# Patient Record
Sex: Male | Born: 1990 | Race: White | Hispanic: No | Smoking: Never smoker
Health system: Southern US, Community
[De-identification: ages and names within clinical notes are randomized; demographics above are authoritative.]

## PROBLEM LIST (undated history)

## (undated) DIAGNOSIS — R569 Unspecified convulsions: Secondary | ICD-10-CM

## (undated) DIAGNOSIS — N201 Calculus of ureter: Secondary | ICD-10-CM

## (undated) DIAGNOSIS — T50901A Poisoning by unspecified drugs, medicaments and biological substances, accidental (unintentional), initial encounter: Secondary | ICD-10-CM

## (undated) HISTORY — DX: Unspecified convulsions: R56.9

---

## 2020-04-11 ENCOUNTER — Emergency Department (HOSPITAL_BASED_OUTPATIENT_CLINIC_OR_DEPARTMENT_OTHER): Payer: Self-pay

## 2020-04-11 ENCOUNTER — Other Ambulatory Visit: Payer: Self-pay

## 2020-04-11 ENCOUNTER — Encounter (HOSPITAL_BASED_OUTPATIENT_CLINIC_OR_DEPARTMENT_OTHER): Payer: Self-pay | Admitting: Emergency Medicine

## 2020-04-11 ENCOUNTER — Emergency Department (HOSPITAL_BASED_OUTPATIENT_CLINIC_OR_DEPARTMENT_OTHER)
Admission: EM | Admit: 2020-04-11 | Discharge: 2020-04-11 | Disposition: A | Discharge status: Home / Self Care | Payer: Self-pay | Attending: Emergency Medicine | Admitting: Emergency Medicine

## 2020-04-11 DIAGNOSIS — R561 Post traumatic seizures: Secondary | ICD-10-CM | POA: Insufficient documentation

## 2020-04-11 DIAGNOSIS — F419 Anxiety disorder, unspecified: Secondary | ICD-10-CM | POA: Insufficient documentation

## 2020-04-11 DIAGNOSIS — H61892 Other specified disorders of left external ear: Secondary | ICD-10-CM | POA: Insufficient documentation

## 2020-04-11 LAB — CBC WITH DIFFERENTIAL/PLATELET
Abs Immature Granulocytes: 0.02 10*3/uL (ref 0.00–0.07)
Basophils Absolute: 0 10*3/uL (ref 0.0–0.1)
Basophils Relative: 1 %
Eosinophils Absolute: 0.5 10*3/uL (ref 0.0–0.5)
Eosinophils Relative: 6 %
HCT: 41.8 % (ref 39.0–52.0)
Hemoglobin: 13.8 g/dL (ref 13.0–17.0)
Immature Granulocytes: 0 %
Lymphocytes Relative: 22 %
Lymphs Abs: 1.8 10*3/uL (ref 0.7–4.0)
MCH: 29.7 pg (ref 26.0–34.0)
MCHC: 33 g/dL (ref 30.0–36.0)
MCV: 90.1 fL (ref 80.0–100.0)
Monocytes Absolute: 1 10*3/uL (ref 0.1–1.0)
Monocytes Relative: 12 %
Neutro Abs: 5 10*3/uL (ref 1.7–7.7)
Neutrophils Relative %: 59 %
Platelets: 287 10*3/uL (ref 150–400)
RBC: 4.64 MIL/uL (ref 4.22–5.81)
RDW: 12.6 % (ref 11.5–15.5)
WBC: 8.3 10*3/uL (ref 4.0–10.5)
nRBC: 0 % (ref 0.0–0.2)

## 2020-04-11 LAB — RAPID URINE DRUG SCREEN, HOSP PERFORMED
Amphetamines: NOT DETECTED
Barbiturates: NOT DETECTED
Benzodiazepines: NOT DETECTED
Cocaine: NOT DETECTED
Opiates: NOT DETECTED
Tetrahydrocannabinol: NOT DETECTED

## 2020-04-11 LAB — BASIC METABOLIC PANEL
Anion gap: 10 (ref 5–15)
BUN: 14 mg/dL (ref 6–20)
CO2: 24 mmol/L (ref 22–32)
Calcium: 8.7 mg/dL — ABNORMAL LOW (ref 8.9–10.3)
Chloride: 99 mmol/L (ref 98–111)
Creatinine, Ser: 0.95 mg/dL (ref 0.61–1.24)
GFR calc Af Amer: >60 mL/min (ref >60)
GFR calc non Af Amer: >60 mL/min (ref >60)
Glucose, Bld: 129 mg/dL — ABNORMAL HIGH (ref 70–99)
Potassium: 4.1 mmol/L (ref 3.5–5.1)
Sodium: 133 mmol/L — ABNORMAL LOW (ref 135–145)

## 2020-04-11 LAB — TROPONIN I (HIGH SENSITIVITY): Troponin I (High Sensitivity): 3 ng/L (ref <18)

## 2020-04-11 LAB — ETHANOL: Alcohol, Ethyl (B): <10 mg/dL (ref <10)

## 2020-04-11 LAB — CBG MONITORING, ED: Glucose-Capillary: 125 mg/dL — ABNORMAL HIGH (ref 70–99)

## 2020-04-11 MED ORDER — SODIUM CHLORIDE 0.9 % IV SOLN: INTRAVENOUS | Status: DC | PRN

## 2020-04-11 MED ORDER — LEVETIRACETAM 500 MG PO TABS: 500.0000 mg | ORAL_TABLET | Freq: Two times a day (BID) | ORAL | 0 refills | Status: DC

## 2020-04-11 MED ORDER — ONDANSETRON HCL 4 MG/2ML IJ SOLN
4.0000 mg | Freq: Once | INTRAMUSCULAR | Status: AC
  Administered 2020-04-11: 4 mg via INTRAVENOUS
  Filled 2020-04-11: qty 2

## 2020-04-11 MED ORDER — LEVETIRACETAM IN NACL 500 MG/100ML IV SOLN
500.0000 mg | Freq: Once | INTRAVENOUS | Status: AC
  Administered 2020-04-11: 500 mg via INTRAVENOUS
  Filled 2020-04-11: qty 100

## 2020-04-11 MED ORDER — LORAZEPAM 2 MG/ML IJ SOLN
1.0000 mg | Freq: Once | INTRAMUSCULAR | Status: AC
  Administered 2020-04-11: 1 mg via INTRAVENOUS
  Filled 2020-04-11: qty 1
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
## 2020-04-11 NOTE — ED Notes (Signed)
Repositioned on stretcher for comfort, lights dimmed in room, encouraged pt to not be on mobile device, pt is very anxious, restless on stretcher, pt reassured.
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
## 2020-04-11 NOTE — ED Notes (Signed)
Pt states he is really worried about what is going on with him, "I am having a lot of nausea, I keep passing out and my body keeps stiffening up", sr x 2 up, NSR noted on monitor, no LOC or syncopal type activity has been noted, med order rec from EDP

## 2020-04-11 NOTE — ED Notes (Signed)
Pt had "passing out" episode while laying flat in CT. Per CT - pt closed his eyes and held his breath, stopped responding to verbal stimuli. Immediately alert to sternal rub. Immediately back to baseline.

## 2020-04-13 ENCOUNTER — Emergency Department (HOSPITAL_COMMUNITY): Payer: Self-pay

## 2020-04-13 ENCOUNTER — Encounter (HOSPITAL_COMMUNITY): Payer: Self-pay

## 2020-04-13 ENCOUNTER — Emergency Department (HOSPITAL_COMMUNITY)
Admission: EM | Admit: 2020-04-13 | Discharge: 2020-04-13 | Discharge status: Against Medical Advice | Payer: Self-pay | Attending: Emergency Medicine | Admitting: Emergency Medicine

## 2020-04-13 DIAGNOSIS — H539 Unspecified visual disturbance: Secondary | ICD-10-CM | POA: Insufficient documentation

## 2020-04-13 DIAGNOSIS — R55 Syncope and collapse: Secondary | ICD-10-CM | POA: Insufficient documentation

## 2020-04-13 DIAGNOSIS — R569 Unspecified convulsions: Secondary | ICD-10-CM | POA: Insufficient documentation

## 2020-04-13 LAB — URINALYSIS, ROUTINE W REFLEX MICROSCOPIC
Bacteria, UA: NONE SEEN
Bilirubin Urine: NEGATIVE
Glucose, UA: NEGATIVE mg/dL
Ketones, ur: NEGATIVE mg/dL
Leukocytes,Ua: NEGATIVE
Nitrite: NEGATIVE
Protein, ur: 30 mg/dL — AB
RBC / HPF: >50 RBC/hpf — ABNORMAL HIGH (ref 0–5)
Specific Gravity, Urine: 1.023 (ref 1.005–1.030)
pH: 6 (ref 5.0–8.0)

## 2020-04-13 LAB — RAPID URINE DRUG SCREEN, HOSP PERFORMED
Amphetamines: NOT DETECTED
Barbiturates: NOT DETECTED
Benzodiazepines: NOT DETECTED
Cocaine: NOT DETECTED
Opiates: NOT DETECTED
Tetrahydrocannabinol: NOT DETECTED

## 2020-04-13 LAB — CBC WITH DIFFERENTIAL/PLATELET
Abs Immature Granulocytes: 0.04 10*3/uL (ref 0.00–0.07)
Basophils Absolute: 0 10*3/uL (ref 0.0–0.1)
Basophils Relative: 0 %
Eosinophils Absolute: 0.7 10*3/uL — ABNORMAL HIGH (ref 0.0–0.5)
Eosinophils Relative: 6 %
HCT: 44.3 % (ref 39.0–52.0)
Hemoglobin: 14.4 g/dL (ref 13.0–17.0)
Immature Granulocytes: 0 %
Lymphocytes Relative: 22 %
Lymphs Abs: 2.7 10*3/uL (ref 0.7–4.0)
MCH: 30.1 pg (ref 26.0–34.0)
MCHC: 32.5 g/dL (ref 30.0–36.0)
MCV: 92.7 fL (ref 80.0–100.0)
Monocytes Absolute: 1.6 10*3/uL — ABNORMAL HIGH (ref 0.1–1.0)
Monocytes Relative: 13 %
Neutro Abs: 7.1 10*3/uL (ref 1.7–7.7)
Neutrophils Relative %: 59 %
Platelets: 319 10*3/uL (ref 150–400)
RBC: 4.78 MIL/uL (ref 4.22–5.81)
RDW: 12.6 % (ref 11.5–15.5)
WBC: 12.1 10*3/uL — ABNORMAL HIGH (ref 4.0–10.5)
nRBC: 0 % (ref 0.0–0.2)

## 2020-04-13 LAB — LACTIC ACID, PLASMA: Lactic Acid, Venous: 1.1 mmol/L (ref 0.5–1.9)

## 2020-04-13 LAB — COMPREHENSIVE METABOLIC PANEL
ALT: 112 U/L — ABNORMAL HIGH (ref 0–44)
AST: 59 U/L — ABNORMAL HIGH (ref 15–41)
Albumin: 4.1 g/dL (ref 3.5–5.0)
Alkaline Phosphatase: 79 U/L (ref 38–126)
Anion gap: 10 (ref 5–15)
BUN: 12 mg/dL (ref 6–20)
CO2: 23 mmol/L (ref 22–32)
Calcium: 8.9 mg/dL (ref 8.9–10.3)
Chloride: 104 mmol/L (ref 98–111)
Creatinine, Ser: 1.15 mg/dL (ref 0.61–1.24)
GFR calc Af Amer: >60 mL/min (ref >60)
GFR calc non Af Amer: >60 mL/min (ref >60)
Glucose, Bld: 100 mg/dL — ABNORMAL HIGH (ref 70–99)
Potassium: 4.4 mmol/L (ref 3.5–5.1)
Sodium: 137 mmol/L (ref 135–145)
Total Bilirubin: 0.4 mg/dL (ref 0.3–1.2)
Total Protein: 7.3 g/dL (ref 6.5–8.1)

## 2020-04-13 LAB — SALICYLATE LEVEL: Salicylate Lvl: <7 mg/dL — ABNORMAL LOW (ref 7.0–30.0)

## 2020-04-13 LAB — ETHANOL: Alcohol, Ethyl (B): <10 mg/dL (ref <10)

## 2020-04-13 LAB — CK: Total CK: 237 U/L (ref 49–397)

## 2020-04-13 LAB — ACETAMINOPHEN LEVEL: Acetaminophen (Tylenol), Serum: <10 ug/mL — ABNORMAL LOW (ref 10–30)

## 2020-04-13 LAB — LIPASE, BLOOD: Lipase: 23 U/L (ref 11–51)

## 2020-04-13 MED ORDER — LORAZEPAM 2 MG/ML IJ SOLN
1.0000 mg | Freq: Once | INTRAMUSCULAR | Status: AC
  Administered 2020-04-13: 1 mg via INTRAVENOUS
  Filled 2020-04-13: qty 1

## 2020-04-13 MED ORDER — SODIUM CHLORIDE 0.9 % IV BOLUS
1000.0000 mL | Freq: Once | INTRAVENOUS | Status: AC
  Administered 2020-04-13: 1000 mL via INTRAVENOUS

## 2020-04-13 MED ORDER — DROPERIDOL 2.5 MG/ML IJ SOLN
2.5000 mg | Freq: Once | INTRAMUSCULAR | Status: AC
  Administered 2020-04-13: 2.5 mg via INTRAVENOUS
  Filled 2020-04-13: qty 2

## 2020-04-13 NOTE — Consult Note (Addendum)
Neurology Consultation Patient was discussed with me by the ED providers.  Given frequency of events (4-5 times daily) and no improvement with Keppra 500 mg, as well as concern for nonepileptic events, I thought it would be reasonable to admit the patient for spell characterization.  However he decided to leave the ED and ripped out his IVs.  Recommended no change in management at this time.  No charge, given patient declined to be evaluated by me.  Addended for charge capture

## 2020-04-13 NOTE — ED Provider Notes (Signed)
[facility] EMERGENCY DEPARTMENT Provider Note   CSN: [id number] Arrival date & time: 04/13/20  0114     History Chief Complaint  Patient presents with  . Seizures    Jordan Adams is a 29 y.o. male who returns to the ED with complaints of recurrent seizures & syncope episodes since his last ED visit. Patient states that for the past 3-4 weeks he has been having seizures S/p head injury which he describes as full body convulsions that last less than 1 minute at a time, has had 2 episodes where he has been incontinent with these episodes. Had 2-6 today which prompted ED visit- he has provided several different numbers when asked about number of seizures today. He also has episodes of syncope in which he gets lightheaded and then briefly loses consciousness- states these are different from his seizures. Seen 09/05 and was started on Keppra, has been taking this as prescribed. He is an overall poor historian. He states his vision is blurry at times. Did recently hit his head again. He is also very nauseated and having vomiting. Denies focal numbness, weakness, chest pain, or dyspnea. Denies alcohol or drug use.   HPI     History reviewed. No pertinent past medical history.  There are no problems to display for this patient.   History reviewed. No pertinent surgical history.     History reviewed. No pertinent family history.  Social History   Tobacco Use  . Smoking status: Never Smoker  . Smokeless tobacco: Current User  Substance Use Topics  . Alcohol use: Never  . Drug use: Not Currently    Comment: past heroine user    Home Medications Prior to Admission medications   Medication Sig Start Date End Date Taking? Authorizing Provider  levETIRAcetam (KEPPRA) 500 MG tablet Take 1 tablet (500 mg total) by mouth 2 (two) times daily. 04/11/20   Molpus, John, MD    Allergies    Tramadol  Review of Systems   Review of Systems  Constitutional: Negative for  chills and fever.  Eyes: Positive for visual disturbance.  Respiratory: Negative for shortness of breath.   Cardiovascular: Negative for chest pain.  Gastrointestinal: Positive for nausea and vomiting. Negative for blood in stool, constipation and diarrhea.  Genitourinary: Negative for dysuria.  Neurological: Positive for seizures and syncope. Negative for weakness and numbness.  All other systems reviewed and are negative.   Physical Exam Updated Vital Signs BP 135/66 (BP Location: Right Arm)   Pulse 95   Temp 98.3 F (36.8 C) (Oral)   Resp 16   Ht 6\' 1"  (1.854 m)   Wt 93 kg   SpO2 97%   BMI 27.05 kg/m   Physical Exam Vitals and nursing note reviewed.  Constitutional:      General: He is not in acute distress.    Appearance: He is well-developed. He is not toxic-appearing.     Comments: Patient is somewhat agitated   HENT:     Head: Normocephalic.     Comments: No raccoon eyes or battle sign.    Ears:     Comments: Dried blood present to external ear bilaterally.  There is no blood in the canal or hemotympanums noted.  No TM perforation present. Eyes:     General:        Right eye: No discharge.        Left eye: No discharge.     Extraocular Movements: Extraocular movements intact.  Conjunctiva/sclera: Conjunctivae normal.     Pupils: Pupils are equal, round, and reactive to light.     Comments: Vision grossly intact.  Cardiovascular:     Rate and Rhythm: Normal rate and regular rhythm.  Pulmonary:     Effort: Pulmonary effort is normal. No respiratory distress.     Breath sounds: Normal breath sounds. No wheezing, rhonchi or rales.  Chest:     Chest wall: No tenderness.  Abdominal:     General: There is no distension.     Palpations: Abdomen is soft.     Tenderness: There is no abdominal tenderness. There is no guarding or rebound.  Musculoskeletal:     Cervical back: Normal range of motion and neck supple. No rigidity.  Skin:    General: Skin is warm and  dry.     Comments: Patient has multiple abrasions scattered throughout his body  Neurological:     Mental Status: He is alert.     Comments: Clear speech.  CN III through XII grossly intact.  Sensation grossly intact bilateral upper and lower extremities.  5 out of 5 symmetric grip strength and strength with plantar dorsiflexion bilaterally.  Patient is ambulatory with a steady gait.  Psychiatric:     Comments: Patient with somewhat odd behavior, he is somewhat hyperverbal, tangential    ED Results / Procedures / Treatments   Labs (all labs ordered are listed, but only abnormal results are displayed) Labs Reviewed  COMPREHENSIVE METABOLIC PANEL - Abnormal; Notable for the following components:      Result Value   Glucose, Bld 100 (*)    AST 59 (*)    ALT 112 (*)    All other components within normal limits  CBC WITH DIFFERENTIAL/PLATELET - Abnormal; Notable for the following components:   WBC 12.1 (*)    Monocytes Absolute 1.6 (*)    Eosinophils Absolute 0.7 (*)    All other components within normal limits  URINALYSIS, ROUTINE W REFLEX MICROSCOPIC - Abnormal; Notable for the following components:   Color, Urine AMBER (*)    APPearance HAZY (*)    Hgb urine dipstick MODERATE (*)    Protein, ur 30 (*)    RBC / HPF >50 (*)    All other components within normal limits  ACETAMINOPHEN LEVEL - Abnormal; Notable for the following components:   Acetaminophen (Tylenol), Serum <10 (*)    All other components within normal limits  SALICYLATE LEVEL - Abnormal; Notable for the following components:   Salicylate Lvl <7.0 (*)    All other components within normal limits  LIPASE, BLOOD  ETHANOL  RAPID URINE DRUG SCREEN, HOSP PERFORMED  CK  LACTIC ACID, PLASMA  LACTIC ACID, PLASMA    EKG None  Radiology No results found.  Procedures Procedures (including critical care time)  Medications Ordered in ED Medications  sodium chloride 0.9 % bolus 1,000 mL (0 mLs Intravenous Stopped  04/13/20 0436)  droperidol (INAPSINE) 2.5 MG/ML injection 2.5 mg (2.5 mg Intravenous Given 04/13/20 0225)  LORazepam (ATIVAN) injection 1 mg (1 mg Intravenous Given 04/13/20 0225)    ED Course  I have reviewed the triage vital signs and the nursing notes.  Pertinent labs & imaging results that were available during my care of the patient were reviewed by me and considered in my medical decision making (see chart for details).  Arieon Corcoran was evaluated in Emergency Department on 04/13/2020 for the symptoms described in the history of present illness. He/she was evaluated in  the context of the global COVID-19 pandemic, which necessitated consideration that the patient might be at risk for infection with the SARS-CoV-2 virus that causes COVID-19. Institutional protocols and algorithms that pertain to the evaluation of patients at risk for COVID-19 are in a state of rapid change based on information released by regulatory bodies including the CDC and federal and state organizations. These policies and algorithms were followed during the patient's care in the ED.    MDM Rules/Calculators/A&P                          Patient presents to the ED with complaints of recurrent seizure/syncope episodes. Nontoxic, a bit agitated with peculiar behavior, vitals WNL. NO focal neurologic deficits noted on exam. Patient intermittently dry heaving.   Additional history obtained:  Additional history obtained from nursing note & chart review. Previous records obtained and reviewed.   Lab Tests:  I Ordered, reviewed, and interpreted labs, which included:  CBC: Mild leukocytosis felt to be nonspecific CMP: Mildly elevated AST/ALT, T bili within normal limits.  Renal function preserved. Lipase: Within normal limits Urinalysis: Hematuria, no UTI UDS: Negative Ethanol level: Negative Salicylate level/acetaminophen level: Within normal limits Lactic acid: Within normal limits CK: Within normal limits  Imaging Studies  ordered:  I ordered imaging studies which included CT head, I independently visualized and interpreted imaging which showed no acute intra-cranial process.   EKG with QTC within normal limits, droperidol and Ativan ordered.  Patient feeling improved S/p interventions above.  No significant electrolyte derangement or acute intracranial abnormality. I discussed with neurologist on-call Dr. Iver Nestle, plan was for admission for EEG monitoring, however upon my reassessment of the patient he is now demanding to leave the hospital immediately, we discussed our recommendation for admission, patient continued to refuse. Patient signing out AGAINST MEDICAL ADVICE- The patient and I discussed the nature and purpose, risks and benefits, as well as, the alternatives of treatment. Time was given to allow the opportunity to ask questions and consider options. After the discussion, the patient decided to refuse admission.  The patient was informed that refusal could lead to, but was not limited to, death, permanent disability, or severe pain. Prior to refusing, I determined that the patient had the capacity to make their decision and understood the consequences of that decision. After refusal, I made every reasonable effort to treat them to the best of my ability-I re- discussed with Dr. Iver Nestle would not recommend changing keppra dosing at this time- appreciate consultation, discussed need to continue this medicine and not to drive with the patient.  The patient was notified that they may return to the emergency department at any time for further treatment.    Findings and plan of care discussed with supervising physician Dr. Manus Gunning who evaluated the patient & is in agreement.   Portions of this note were generated with Scientist, clinical (histocompatibility and immunogenetics). Dictation errors may occur despite best attempts at proofreading.  Final Clinical Impression(s) / ED Diagnoses Final diagnoses:  Seizure-like activity [facility])    Rx / DC  Orders ED Discharge Orders    None       Cherly Anderson, PA-C 04/13/20 0553    Glynn Octave, MD 04/15/20 [phone]

## 2020-04-13 NOTE — Discharge Instructions (Signed)
You were seen in the emergency department today for several episodes of seizure activity.  Your labs were overall reassuring.  The CT of your head did not show any problems with your brain such as bleeding.  We recommended that she stay in the hospital and be admitted to that we can further monitor you and evaluate your seizure activity which you are refusing.  You are leaving the hospital AGAINST MEDICAL ADVICE.  Continue to take your Keppra.  Do not drive or operate heavy machinery at any time until cleared by a neurologist.  Please follow-up with neurology within the next 48 hours.  You may return to the emergency department at any time for new or worsening symptoms, any other concerns, or should you wish to be reevaluated and potentially admitted.

## 2020-04-13 NOTE — ED Notes (Signed)
Pt violently dryheaving while in the hallway, requesting water, then spitting into emesis bag. Pt denies drug use, although intermittently drowsy. Pt wakes up speaking very loudly and clapping. Pt moved into green zone room due to behavior.
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
## 2020-04-13 NOTE — ED Notes (Signed)
Pt awake at this time, asked to be unhooked from monitor and requesting to go home now, PA made aware. Pt ambulated to RR with steady gait, alert and 0x4.

## 2020-04-14 ENCOUNTER — Ambulatory Visit (INDEPENDENT_AMBULATORY_CARE_PROVIDER_SITE_OTHER): Payer: Self-pay | Admitting: Neurology

## 2020-04-14 ENCOUNTER — Encounter: Payer: Self-pay | Admitting: Neurology

## 2020-04-14 ENCOUNTER — Other Ambulatory Visit: Payer: Self-pay

## 2020-04-14 VITALS — BP 147/87 | HR 76 | Resp 18 | Ht 73.0 in | Wt 191.0 lb

## 2020-04-14 DIAGNOSIS — R4020 Unspecified coma: Secondary | ICD-10-CM

## 2020-04-14 DIAGNOSIS — R569 Unspecified convulsions: Secondary | ICD-10-CM
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
## 2020-07-07 ENCOUNTER — Other Ambulatory Visit: Payer: Self-pay

## 2020-07-07 ENCOUNTER — Emergency Department (HOSPITAL_BASED_OUTPATIENT_CLINIC_OR_DEPARTMENT_OTHER)
Admission: EM | Admit: 2020-07-07 | Discharge: 2020-07-07 | Disposition: A | Discharge status: Home / Self Care | Payer: Self-pay | Attending: Emergency Medicine | Admitting: Emergency Medicine

## 2020-07-07 DIAGNOSIS — R0989 Other specified symptoms and signs involving the circulatory and respiratory systems: Secondary | ICD-10-CM | POA: Insufficient documentation

## 2020-07-07 DIAGNOSIS — R059 Cough, unspecified: Secondary | ICD-10-CM | POA: Insufficient documentation

## 2020-07-07 DIAGNOSIS — J020 Streptococcal pharyngitis: Secondary | ICD-10-CM | POA: Insufficient documentation

## 2020-07-07 LAB — GROUP A STREP BY PCR: Group A Strep by PCR: DETECTED — AB

## 2020-07-07 MED ORDER — DEXAMETHASONE 6 MG PO TABS
10.0000 mg | ORAL_TABLET | Freq: Once | ORAL | Status: AC
  Administered 2020-07-07: 10 mg via ORAL
  Filled 2020-07-07: qty 1

## 2020-07-07 MED ORDER — AMOXICILLIN 500 MG PO CAPS: 1000.0000 mg | ORAL_CAPSULE | Freq: Every day | ORAL | 0 refills | Status: AC

## 2020-07-07 NOTE — ED Triage Notes (Signed)
Pt reports sore throat, fever and chills x2 days. Per pt his wife has strep.

## 2020-07-07 NOTE — ED Provider Notes (Signed)
[facility] EMERGENCY DEPARTMENT Provider Note   CSN: [id number] Arrival date & time: 07/07/20  1225     History Chief Complaint  Patient presents with   Sore Throat    Jordan Adams is a 29 y.o. male.  HPI      2 days of fever, sore throat.  Reports chills, headache, occasional cough and feeling of chest congestion.  His his wife was diagnosed with strep throat.  He otherwise denies nausea, vomiting, abdominal pain, diarrhea, shortness of breath.  Past Medical History:  Diagnosis Date   Seizures (HCC)     There are no problems to display for this patient.   No past surgical history on file.     No family history on file.  Social History   Tobacco Use   Smoking status: Never Smoker   Smokeless tobacco: Current User  Vaping Use   Vaping Use: Never used  Substance Use Topics   Alcohol use: Never   Drug use: Not Currently    Comment: past heroine user    Home Medications Prior to Admission medications   Medication Sig Start Date End Date Taking? Authorizing Provider  amoxicillin (AMOXIL) 500 MG capsule Take 2 capsules (1,000 mg total) by mouth daily for 10 days. 07/07/20 07/17/20  Alvira Monday, MD  levETIRAcetam (KEPPRA) 500 MG tablet Take 1 tablet (500 mg total) by mouth 2 (two) times daily. 04/11/20   Molpus, John, MD    Allergies    Tramadol  Review of Systems   Review of Systems  Constitutional: Positive for chills and fever.  HENT: Positive for sore throat.   Respiratory: Positive for cough. Negative for shortness of breath.   Cardiovascular: Positive for chest pain.  Gastrointestinal: Negative for abdominal pain, diarrhea, nausea and vomiting.  Skin: Negative for rash.  Neurological: Positive for headaches. Negative for syncope.    Physical Exam Updated Vital Signs BP 130/65 (BP Location: Left Arm)    Pulse 70    Temp 98.7 F (37.1 C) (Oral)    Resp 18    Ht 6\' 1"  (1.854 m)    Wt 94.3 kg    SpO2 99%    BMI 27.44 kg/m    Physical Exam Vitals and nursing note reviewed.  Constitutional:      General: He is not in acute distress.    Appearance: Normal appearance. He is not ill-appearing, toxic-appearing or diaphoretic.  HENT:     Head: Normocephalic.     Mouth/Throat:     Tonsils: No tonsillar exudate or tonsillar abscesses.  Eyes:     Conjunctiva/sclera: Conjunctivae normal.  Cardiovascular:     Rate and Rhythm: Normal rate and regular rhythm.     Pulses: Normal pulses.  Pulmonary:     Effort: Pulmonary effort is normal. No respiratory distress.  Musculoskeletal:        General: No deformity or signs of injury.     Cervical back: No rigidity.  Skin:    General: Skin is warm and dry.     Coloration: Skin is not jaundiced or pale.  Neurological:     General: No focal deficit present.     Mental Status: He is alert and oriented to person, place, and time.     ED Results / Procedures / Treatments   Labs (all labs ordered are listed, but only abnormal results are displayed) Labs Reviewed  GROUP A STREP BY PCR - Abnormal; Notable for the following components:      Result Value  Group A Strep by PCR DETECTED (*)    All other components within normal limits    EKG None  Radiology No results found.  Procedures Procedures (including critical care time)  Medications Ordered in ED Medications  dexamethasone (DECADRON) tablet 10 mg (has no administration in time range)    ED Course  I have reviewed the triage vital signs and the nursing notes.  Pertinent labs & imaging results that were available during my care of the patient were reviewed by me and considered in my medical decision making (see chart for details).    MDM Rules/Calculators/A&P                          29 year old male presents with concern for sore throat in setting of wife with recent positive strep test.  No signs of peritonsillar, retropharyngeal abscess on exam.  He describes some chest discomfort, discussed  getting an EKG, however when nurse went to perform it he declined testing.  Overall given symptoms I have low suspicion for this ACS or pericarditis.  Strep test is positive for strep.  Given dose of Decadron in the emergency department and prescription for amoxicillin.   Final Clinical Impression(s) / ED Diagnoses Final diagnoses:  Strep pharyngitis    Rx / DC Orders ED Discharge Orders         Ordered    amoxicillin (AMOXIL) 500 MG capsule  Daily        07/07/20 1534           Alvira Monday, MD 07/08/20 [phone]

## 2020-07-07 NOTE — ED Notes (Signed)
Pt refusing EKG, states his chest feels congested, states he is "not worried about it". Provider notified.

## 2020-07-15 ENCOUNTER — Encounter (HOSPITAL_BASED_OUTPATIENT_CLINIC_OR_DEPARTMENT_OTHER): Payer: Self-pay

## 2020-07-15 ENCOUNTER — Emergency Department (HOSPITAL_BASED_OUTPATIENT_CLINIC_OR_DEPARTMENT_OTHER)
Admission: EM | Admit: 2020-07-15 | Discharge: 2020-07-15 | Disposition: A | Discharge status: Home / Self Care | Payer: Self-pay | Attending: Emergency Medicine | Admitting: Emergency Medicine

## 2020-07-15 ENCOUNTER — Emergency Department (HOSPITAL_BASED_OUTPATIENT_CLINIC_OR_DEPARTMENT_OTHER): Payer: Self-pay

## 2020-07-15 ENCOUNTER — Other Ambulatory Visit: Payer: Self-pay

## 2020-07-15 DIAGNOSIS — R39198 Other difficulties with micturition: Secondary | ICD-10-CM | POA: Insufficient documentation

## 2020-07-15 DIAGNOSIS — R319 Hematuria, unspecified: Secondary | ICD-10-CM | POA: Insufficient documentation

## 2020-07-15 DIAGNOSIS — R109 Unspecified abdominal pain: Secondary | ICD-10-CM | POA: Insufficient documentation

## 2020-07-15 DIAGNOSIS — Z79899 Other long term (current) drug therapy: Secondary | ICD-10-CM | POA: Insufficient documentation

## 2020-07-15 HISTORY — DX: Calculus of ureter: N20.1

## 2020-07-15 LAB — URINALYSIS, ROUTINE W REFLEX MICROSCOPIC
Bilirubin Urine: NEGATIVE
Glucose, UA: NEGATIVE mg/dL
Hgb urine dipstick: NEGATIVE
Ketones, ur: NEGATIVE mg/dL
Leukocytes,Ua: NEGATIVE
Nitrite: NEGATIVE
Protein, ur: NEGATIVE mg/dL
Specific Gravity, Urine: 1.025 (ref 1.005–1.030)
pH: 6 (ref 5.0–8.0)

## 2020-07-15 MED ORDER — KETOROLAC TROMETHAMINE 15 MG/ML IJ SOLN
15.0000 mg | Freq: Once | INTRAMUSCULAR | Status: AC
  Administered 2020-07-15: 15 mg via INTRAVENOUS
  Filled 2020-07-15: qty 1

## 2020-07-15 MED ORDER — ONDANSETRON HCL 4 MG/2ML IJ SOLN
4.0000 mg | Freq: Once | INTRAMUSCULAR | Status: AC
  Administered 2020-07-15: 4 mg via INTRAVENOUS
  Filled 2020-07-15: qty 2

## 2020-07-15 MED ORDER — OXYCODONE-ACETAMINOPHEN 10-325 MG PO TABS: 1.0000 | ORAL_TABLET | Freq: Four times a day (QID) | ORAL | 0 refills | Status: DC | PRN

## 2020-07-15 MED ORDER — ONDANSETRON 8 MG PO TBDP: 8.0000 mg | ORAL_TABLET | Freq: Three times a day (TID) | ORAL | 0 refills | Status: DC | PRN

## 2020-07-15 MED ORDER — HYDROMORPHONE HCL 1 MG/ML IJ SOLN
1.0000 mg | Freq: Once | INTRAMUSCULAR | Status: AC
  Administered 2020-07-15: 1 mg via INTRAVENOUS
  Filled 2020-07-15: qty 1

## 2020-07-15 NOTE — ED Triage Notes (Signed)
Flank pain x3 days, pt thinks he has a kidney stone, took iubprofen ~2200 yesterday

## 2020-07-15 NOTE — ED Notes (Signed)
Patient transported to CT 

## 2020-07-15 NOTE — ED Notes (Signed)
Pt called for ride home, IV removed, vitals WNL. Pt left ED w/o incident - verbalized understanding of where to obtain prescriptions.

## 2020-07-15 NOTE — ED Provider Notes (Signed)
[facility] Provider Note: Lowella Dell, MD, FACEP  CSN: [id number] MRN: [id number] ARRIVAL: 07/15/20 at 0318 ROOM: [facility]   CHIEF COMPLAINT  Flank Pain   HISTORY OF PRESENT ILLNESS  07/15/20 3:45 AM Jordan Adams is a 29 y.o. male with 3 days of left flank pain.  The pain is severe, rated as a 10 out of 10, and like previous kidney stones.  It is somewhat worse with movement.  He is having difficulty finding a comfortable position.  It does not radiate to his left lower quadrant.  He has had hematuria and difficulty urinating.  His pain worsened yesterday and he began having associated nausea and vomiting and a near syncopal episode.  He has taken ibuprofen without significant relief.   Past Medical History:  Diagnosis Date  . Seizures (HCC)   . Ureterolithiasis     History reviewed. No pertinent surgical history.  History reviewed. No pertinent family history.  Social History   Tobacco Use  . Smoking status: Never Smoker  . Smokeless tobacco: Current User  Vaping Use  . Vaping Use: Never used  Substance Use Topics  . Alcohol use: Never  . Drug use: Not Currently    Comment: past heroine user    Prior to Admission medications   Medication Sig Start Date End Date Taking? Authorizing Provider  amoxicillin (AMOXIL) 500 MG capsule Take 2 capsules (1,000 mg total) by mouth daily for 10 days. 07/07/20 07/17/20  Alvira Monday, MD  levETIRAcetam (KEPPRA) 500 MG tablet Take 1 tablet (500 mg total) by mouth 2 (two) times daily. 04/11/20   Cyrstal Leitz, MD  ondansetron (ZOFRAN ODT) 8 MG disintegrating tablet Take 1 tablet (8 mg total) by mouth every 8 (eight) hours as needed for nausea or vomiting. 07/15/20   Riel Hirschman, MD  oxyCODONE-acetaminophen (PERCOCET) 10-325 MG tablet Take 1 tablet by mouth every 6 (six) hours as needed for pain. 07/15/20   Yameli Delamater, MD    Allergies Tramadol   REVIEW OF SYSTEMS  Negative except as noted here or in the History of  Present Illness.   PHYSICAL EXAMINATION  Initial Vital Signs Blood pressure 122/77, pulse 69, temperature 98 F (36.7 C), resp. rate 18, height 6\' 1"  (1.854 m), weight 95.3 kg, SpO2 99 %.  Examination General: Well-developed, well-nourished male in no acute distress; appearance consistent with age of record HENT: normocephalic; atraumatic Eyes: pupils equal, round and reactive to light; extraocular muscles intact Neck: supple Heart: regular rate and rhythm Lungs: clear to auscultation bilaterally Abdomen: soft; nondistended; nontender; bowel sounds present GU: Mild left CVA tenderness Extremities: No deformity; full range of motion; pulses normal Neurologic: Awake, alert and oriented; motor function intact in all extremities and symmetric; no facial droop Skin: Warm and dry Psychiatric: Grimacing   RESULTS  Summary of this visit's results, reviewed and interpreted by myself:   EKG Interpretation  Date/Time:    Ventricular Rate:    PR Interval:    QRS Duration:   QT Interval:    QTC Calculation:   R Axis:     Text Interpretation:        Laboratory Studies: Results for orders placed or performed during the hospital encounter of 07/15/20 (from the past 24 hour(s))  Urinalysis, Routine w reflex microscopic Urine, Clean Catch     Status: None   Collection Time: 07/15/20  4:18 AM  Result Value Ref Range   Color, Urine YELLOW YELLOW   APPearance CLEAR CLEAR   Specific Gravity,  Urine 1.025 1.005 - 1.030   pH 6.0 5.0 - 8.0   Glucose, UA NEGATIVE NEGATIVE mg/dL   Hgb urine dipstick NEGATIVE NEGATIVE   Bilirubin Urine NEGATIVE NEGATIVE   Ketones, ur NEGATIVE NEGATIVE mg/dL   Protein, ur NEGATIVE NEGATIVE mg/dL   Nitrite NEGATIVE NEGATIVE   Leukocytes,Ua NEGATIVE NEGATIVE   Imaging Studies: CT Renal Stone Study  Result Date: 07/15/2020 CLINICAL DATA:  Flank pain for 3 days.  Possible kidney stone EXAM: CT ABDOMEN AND PELVIS WITHOUT CONTRAST TECHNIQUE: Multidetector CT  imaging of the abdomen and pelvis was performed following the standard protocol without IV contrast. COMPARISON:  None. FINDINGS: Lower chest:  No contributory findings. Hepatobiliary: No focal liver abnormality.No evidence of biliary obstruction or stone. Pancreas: Unremarkable. Spleen: Unremarkable. Adrenals/Urinary Tract: Negative adrenals. No hydronephrosis or stone. Unremarkable bladder. Stomach/Bowel:  No obstruction. No appendicitis. Vascular/Lymphatic: No acute vascular abnormality. No mass or adenopathy. Reproductive:No pathologic findings. Other: No ascites or pneumoperitoneum. Musculoskeletal: No acute abnormalities. Chronic bilateral L5 pars defect. No listhesis IMPRESSION: No acute finding.  No hydronephrosis or urolithiasis. Electronically Signed   By: Marnee Spring M.D.   On: 07/15/2020 04:17    ED COURSE and MDM  Nursing notes, initial and subsequent vitals signs, including pulse oximetry, reviewed and interpreted by myself.  Vitals:   07/15/20 0331 07/15/20 0332 07/15/20 0354 07/15/20 0445  BP: 122/77  137/75 97/78  Pulse: 69  72 63  Resp: 18  18 17   Temp: 98 F (36.7 C)     SpO2: 99%  100% 100%  Weight:  95.3 kg    Height:  6\' 1"  (1.854 m)     Medications  ondansetron (ZOFRAN) injection 4 mg (4 mg Intravenous Given 07/15/20 0352)  HYDROmorphone (DILAUDID) injection 1 mg (1 mg Intravenous Given 07/15/20 0352)  ketorolac (TORADOL) 15 MG/ML injection 15 mg (15 mg Intravenous Given 07/15/20 0434)   5:20 AM Times now well controlled with IV meds.  Patient advised of negative urinalysis and negative CT scan.  His symptomatology suggests kidney stone that has passed.  He may have some residual pain but should be improving.  He was advised this could be musculoskeletal or neuropathic pain.  PROCEDURES  Procedures   ED DIAGNOSES     ICD-10-CM   1. Left flank pain  R10.9        Mammie Meras, 14/9/21, MD 07/15/20 [phone]

## 2021-02-10 ENCOUNTER — Emergency Department (HOSPITAL_BASED_OUTPATIENT_CLINIC_OR_DEPARTMENT_OTHER): Admission: EM | Admit: 2021-02-10 | Discharge: 2021-02-10 | Discharge status: Against Medical Advice | Payer: Self-pay

## 2021-03-04 ENCOUNTER — Encounter (HOSPITAL_COMMUNITY): Payer: Self-pay | Admitting: Internal Medicine

## 2021-03-04 ENCOUNTER — Other Ambulatory Visit: Payer: Self-pay

## 2021-03-04 ENCOUNTER — Inpatient Hospital Stay (HOSPITAL_COMMUNITY)
Admission: EM | Admit: 2021-03-04 | Discharge: 2021-03-07 | DRG: 871 | Disposition: A | Discharge status: Home / Self Care | Payer: Self-pay | Attending: Critical Care Medicine | Admitting: Critical Care Medicine

## 2021-03-04 ENCOUNTER — Emergency Department (HOSPITAL_COMMUNITY): Payer: Self-pay

## 2021-03-04 ENCOUNTER — Inpatient Hospital Stay (HOSPITAL_COMMUNITY): Payer: Self-pay

## 2021-03-04 DIAGNOSIS — G928 Other toxic encephalopathy: Secondary | ICD-10-CM | POA: Diagnosis present

## 2021-03-04 DIAGNOSIS — A419 Sepsis, unspecified organism: Principal | ICD-10-CM | POA: Diagnosis present

## 2021-03-04 DIAGNOSIS — G9340 Encephalopathy, unspecified: Secondary | ICD-10-CM

## 2021-03-04 DIAGNOSIS — Z20822 Contact with and (suspected) exposure to covid-19: Secondary | ICD-10-CM | POA: Diagnosis present

## 2021-03-04 DIAGNOSIS — T148XXA Other injury of unspecified body region, initial encounter: Secondary | ICD-10-CM | POA: Diagnosis present

## 2021-03-04 DIAGNOSIS — H109 Unspecified conjunctivitis: Secondary | ICD-10-CM | POA: Diagnosis present

## 2021-03-04 DIAGNOSIS — G40909 Epilepsy, unspecified, not intractable, without status epilepticus: Secondary | ICD-10-CM | POA: Diagnosis present

## 2021-03-04 DIAGNOSIS — X58XXXA Exposure to other specified factors, initial encounter: Secondary | ICD-10-CM | POA: Diagnosis present

## 2021-03-04 DIAGNOSIS — J80 Acute respiratory distress syndrome: Secondary | ICD-10-CM | POA: Diagnosis present

## 2021-03-04 DIAGNOSIS — J982 Interstitial emphysema: Secondary | ICD-10-CM | POA: Diagnosis not present

## 2021-03-04 DIAGNOSIS — R300 Dysuria: Secondary | ICD-10-CM | POA: Diagnosis present

## 2021-03-04 DIAGNOSIS — T402X4A Poisoning by other opioids, undetermined, initial encounter: Secondary | ICD-10-CM

## 2021-03-04 DIAGNOSIS — J690 Pneumonitis due to inhalation of food and vomit: Secondary | ICD-10-CM | POA: Diagnosis present

## 2021-03-04 DIAGNOSIS — T401X1A Poisoning by heroin, accidental (unintentional), initial encounter: Secondary | ICD-10-CM | POA: Diagnosis present

## 2021-03-04 DIAGNOSIS — N179 Acute kidney failure, unspecified: Secondary | ICD-10-CM | POA: Diagnosis present

## 2021-03-04 DIAGNOSIS — F1120 Opioid dependence, uncomplicated: Secondary | ICD-10-CM | POA: Diagnosis present

## 2021-03-04 DIAGNOSIS — Z9151 Personal history of suicidal behavior: Secondary | ICD-10-CM

## 2021-03-04 DIAGNOSIS — E875 Hyperkalemia: Secondary | ICD-10-CM | POA: Diagnosis present

## 2021-03-04 DIAGNOSIS — R7401 Elevation of levels of liver transaminase levels: Secondary | ICD-10-CM | POA: Diagnosis present

## 2021-03-04 DIAGNOSIS — E872 Acidosis: Secondary | ICD-10-CM | POA: Diagnosis present

## 2021-03-04 DIAGNOSIS — Z79899 Other long term (current) drug therapy: Secondary | ICD-10-CM

## 2021-03-04 DIAGNOSIS — T402X1A Poisoning by other opioids, accidental (unintentional), initial encounter: Secondary | ICD-10-CM | POA: Diagnosis present

## 2021-03-04 DIAGNOSIS — Y92481 Parking lot as the place of occurrence of the external cause: Secondary | ICD-10-CM

## 2021-03-04 DIAGNOSIS — Z87442 Personal history of urinary calculi: Secondary | ICD-10-CM

## 2021-03-04 DIAGNOSIS — Z885 Allergy status to narcotic agent status: Secondary | ICD-10-CM

## 2021-03-04 DIAGNOSIS — J9601 Acute respiratory failure with hypoxia: Secondary | ICD-10-CM

## 2021-03-04 LAB — CK: Total CK: 268 U/L (ref 49–397)

## 2021-03-04 LAB — CBC WITH DIFFERENTIAL/PLATELET
Abs Immature Granulocytes: 0.05 10*3/uL (ref 0.00–0.07)
Basophils Absolute: 0 10*3/uL (ref 0.0–0.1)
Basophils Relative: 0 %
Eosinophils Absolute: 0 10*3/uL (ref 0.0–0.5)
Eosinophils Relative: 0 %
HCT: 49.1 % (ref 39.0–52.0)
Hemoglobin: 15.7 g/dL (ref 13.0–17.0)
Immature Granulocytes: 0 %
Lymphocytes Relative: 5 %
Lymphs Abs: 0.7 10*3/uL (ref 0.7–4.0)
MCH: 30.5 pg (ref 26.0–34.0)
MCHC: 32 g/dL (ref 30.0–36.0)
MCV: 95.3 fL (ref 80.0–100.0)
Monocytes Absolute: 0.9 10*3/uL (ref 0.1–1.0)
Monocytes Relative: 7 %
Neutro Abs: 11 10*3/uL — ABNORMAL HIGH (ref 1.7–7.7)
Neutrophils Relative %: 88 %
Platelets: 270 10*3/uL (ref 150–400)
RBC: 5.15 MIL/uL (ref 4.22–5.81)
RDW: 12.2 % (ref 11.5–15.5)
WBC: 12.6 10*3/uL — ABNORMAL HIGH (ref 4.0–10.5)
nRBC: 0 % (ref 0.0–0.2)

## 2021-03-04 LAB — ETHANOL: Alcohol, Ethyl (B): <10 mg/dL (ref <10)

## 2021-03-04 LAB — I-STAT ARTERIAL BLOOD GAS, ED
Acid-base deficit: 9 mmol/L — ABNORMAL HIGH (ref 0.0–2.0)
Acid-base deficit: 4 mmol/L — ABNORMAL HIGH (ref 0.0–2.0)
Bicarbonate: 22.9 mmol/L (ref 20.0–28.0)
Bicarbonate: 22 mmol/L (ref 20.0–28.0)
Calcium, Ion: 1.16 mmol/L (ref 1.15–1.40)
Calcium, Ion: 1.04 mmol/L — ABNORMAL LOW (ref 1.15–1.40)
HCT: 50 % (ref 39.0–52.0)
HCT: 46 % (ref 39.0–52.0)
Hemoglobin: 17 g/dL (ref 13.0–17.0)
Hemoglobin: 15.6 g/dL (ref 13.0–17.0)
O2 Saturation: 78 %
O2 Saturation: 100 %
Potassium: 5.5 mmol/L — ABNORMAL HIGH (ref 3.5–5.1)
Potassium: 5.1 mmol/L (ref 3.5–5.1)
Sodium: 131 mmol/L — ABNORMAL LOW (ref 135–145)
Sodium: 133 mmol/L — ABNORMAL LOW (ref 135–145)
TCO2: 25 mmol/L (ref 22–32)
TCO2: 23 mmol/L (ref 22–32)
pCO2 arterial: 72.4 mmHg (ref 32.0–48.0)
pCO2 arterial: 41 mmHg (ref 32.0–48.0)
pH, Arterial: 7.109 — CL (ref 7.350–7.450)
pH, Arterial: 7.336 — ABNORMAL LOW (ref 7.350–7.450)
pO2, Arterial: 59 mmHg — ABNORMAL LOW (ref 83.0–108.0)
pO2, Arterial: 433 mmHg — ABNORMAL HIGH (ref 83.0–108.0)

## 2021-03-04 LAB — GLUCOSE, CAPILLARY
Glucose-Capillary: 95 mg/dL (ref 70–99)
Glucose-Capillary: 100 mg/dL — ABNORMAL HIGH (ref 70–99)
Glucose-Capillary: 125 mg/dL — ABNORMAL HIGH (ref 70–99)
Glucose-Capillary: 110 mg/dL — ABNORMAL HIGH (ref 70–99)

## 2021-03-04 LAB — BASIC METABOLIC PANEL
Anion gap: 9 (ref 5–15)
BUN: 19 mg/dL (ref 6–20)
CO2: 21 mmol/L — ABNORMAL LOW (ref 22–32)
Calcium: 7.6 mg/dL — ABNORMAL LOW (ref 8.9–10.3)
Chloride: 104 mmol/L (ref 98–111)
Creatinine, Ser: 1.4 mg/dL — ABNORMAL HIGH (ref 0.61–1.24)
GFR, Estimated: >60 mL/min (ref >60)
Glucose, Bld: 106 mg/dL — ABNORMAL HIGH (ref 70–99)
Potassium: 4.4 mmol/L (ref 3.5–5.1)
Sodium: 134 mmol/L — ABNORMAL LOW (ref 135–145)

## 2021-03-04 LAB — HIV ANTIBODY (ROUTINE TESTING W REFLEX): HIV Screen 4th Generation wRfx: NONREACTIVE

## 2021-03-04 LAB — RAPID URINE DRUG SCREEN, HOSP PERFORMED
Amphetamines: NOT DETECTED
Barbiturates: NOT DETECTED
Benzodiazepines: NOT DETECTED
Cocaine: NOT DETECTED
Opiates: POSITIVE — AB
Tetrahydrocannabinol: NOT DETECTED

## 2021-03-04 LAB — LACTIC ACID, PLASMA
Lactic Acid, Venous: 8.2 mmol/L (ref 0.5–1.9)
Lactic Acid, Venous: 2.5 mmol/L (ref 0.5–1.9)
Lactic Acid, Venous: 2.1 mmol/L (ref 0.5–1.9)

## 2021-03-04 LAB — URINALYSIS, ROUTINE W REFLEX MICROSCOPIC
Bilirubin Urine: NEGATIVE
Glucose, UA: 150 mg/dL — AB
Ketones, ur: NEGATIVE mg/dL
Leukocytes,Ua: NEGATIVE
Nitrite: NEGATIVE
Protein, ur: 30 mg/dL — AB
Specific Gravity, Urine: 1.019 (ref 1.005–1.030)
pH: 5 (ref 5.0–8.0)

## 2021-03-04 LAB — COMPREHENSIVE METABOLIC PANEL
ALT: 189 U/L — ABNORMAL HIGH (ref 0–44)
AST: 157 U/L — ABNORMAL HIGH (ref 15–41)
Albumin: 3.7 g/dL (ref 3.5–5.0)
Alkaline Phosphatase: 92 U/L (ref 38–126)
Anion gap: 15 (ref 5–15)
BUN: 18 mg/dL (ref 6–20)
CO2: 19 mmol/L — ABNORMAL LOW (ref 22–32)
Calcium: 8.1 mg/dL — ABNORMAL LOW (ref 8.9–10.3)
Chloride: 99 mmol/L (ref 98–111)
Creatinine, Ser: 2.05 mg/dL — ABNORMAL HIGH (ref 0.61–1.24)
GFR, Estimated: 44 mL/min — ABNORMAL LOW (ref >60)
Glucose, Bld: 289 mg/dL — ABNORMAL HIGH (ref 70–99)
Potassium: 5.3 mmol/L — ABNORMAL HIGH (ref 3.5–5.1)
Sodium: 133 mmol/L — ABNORMAL LOW (ref 135–145)
Total Bilirubin: 0.9 mg/dL (ref 0.3–1.2)
Total Protein: 6.9 g/dL (ref 6.5–8.1)

## 2021-03-04 LAB — MRSA NEXT GEN BY PCR, NASAL: MRSA by PCR Next Gen: NOT DETECTED

## 2021-03-04 LAB — ACETAMINOPHEN LEVEL: Acetaminophen (Tylenol), Serum: <10 ug/mL — ABNORMAL LOW (ref 10–30)

## 2021-03-04 LAB — BRAIN NATRIURETIC PEPTIDE: B Natriuretic Peptide: 10.6 pg/mL (ref 0.0–100.0)

## 2021-03-04 LAB — RESP PANEL BY RT-PCR (FLU A&B, COVID) ARPGX2
Influenza A by PCR: NEGATIVE
Influenza B by PCR: NEGATIVE
SARS Coronavirus 2 by RT PCR: NEGATIVE

## 2021-03-04 LAB — SALICYLATE LEVEL: Salicylate Lvl: <7 mg/dL — ABNORMAL LOW (ref 7.0–30.0)

## 2021-03-04 LAB — TROPONIN I (HIGH SENSITIVITY)
Troponin I (High Sensitivity): 32 ng/L — ABNORMAL HIGH (ref <18)
Troponin I (High Sensitivity): 37 ng/L — ABNORMAL HIGH (ref <18)

## 2021-03-04 MED ORDER — PROPOFOL 1000 MG/100ML IV EMUL
INTRAVENOUS | Status: AC
  Filled 2021-03-04: qty 100

## 2021-03-04 MED ORDER — PROPOFOL 1000 MG/100ML IV EMUL
0.0000 ug/kg/min | INTRAVENOUS | Status: DC
  Administered 2021-03-04: 30 ug/kg/min via INTRAVENOUS

## 2021-03-04 MED ORDER — POLYETHYLENE GLYCOL 3350 17 G PO PACK
17.0000 g | PACK | Freq: Every day | ORAL | Status: DC
Start: 2021-03-04 — End: 2021-03-06
  Administered 2021-03-05: 17 g
  Filled 2021-03-04: qty 1

## 2021-03-04 MED ORDER — FENTANYL CITRATE (PF) 100 MCG/2ML IJ SOLN: 50.0000 ug | INTRAMUSCULAR | Status: DC | PRN

## 2021-03-04 MED ORDER — CIPROFLOXACIN HCL 0.3 % OP SOLN
2.0000 [drp] | OPHTHALMIC | Status: AC
  Administered 2021-03-04 – 2021-03-06 (×17): 2 [drp] via OPHTHALMIC
  Filled 2021-03-04: qty 2.5

## 2021-03-04 MED ORDER — LEVETIRACETAM IN NACL 500 MG/100ML IV SOLN
500.0000 mg | Freq: Two times a day (BID) | INTRAVENOUS | Status: DC
  Administered 2021-03-04 – 2021-03-06 (×5): 500 mg via INTRAVENOUS
  Filled 2021-03-04 (×6): qty 100

## 2021-03-04 MED ORDER — SODIUM CHLORIDE 0.9 % IV BOLUS
1000.0000 mL | Freq: Once | INTRAVENOUS | Status: AC
  Administered 2021-03-04: 1000 mL via INTRAVENOUS

## 2021-03-04 MED ORDER — ETOMIDATE 2 MG/ML IV SOLN
20.0000 mg | Freq: Once | INTRAVENOUS | Status: AC
  Administered 2021-03-04: 20 mg via INTRAVENOUS

## 2021-03-04 MED ORDER — PROPOFOL BOLUS VIA INFUSION
50.0000 mg | Freq: Once | INTRAVENOUS | Status: AC
  Administered 2021-03-04: 50 mg via INTRAVENOUS
  Filled 2021-03-04: qty 50

## 2021-03-04 MED ORDER — HYDROCORTISONE 1 % EX CREA
1.0000 "application " | TOPICAL_CREAM | Freq: Two times a day (BID) | CUTANEOUS | Status: DC
  Administered 2021-03-04 – 2021-03-07 (×6): 1 via TOPICAL
  Filled 2021-03-04 (×4): qty 28

## 2021-03-04 MED ORDER — HEPARIN SODIUM (PORCINE) 5000 UNIT/ML IJ SOLN
5000.0000 [IU] | Freq: Three times a day (TID) | INTRAMUSCULAR | Status: DC
  Administered 2021-03-04 – 2021-03-06 (×6): 5000 [IU] via SUBCUTANEOUS
  Filled 2021-03-04 (×7): qty 1

## 2021-03-04 MED ORDER — CHLORHEXIDINE GLUCONATE CLOTH 2 % EX PADS
6.0000 | MEDICATED_PAD | Freq: Every day | CUTANEOUS | Status: DC
  Administered 2021-03-04 – 2021-03-06 (×3): 6 via TOPICAL

## 2021-03-04 MED ORDER — CIPROFLOXACIN HCL 0.3 % OP SOLN
2.0000 [drp] | OPHTHALMIC | Status: DC
  Administered 2021-03-06 – 2021-03-07 (×4): 2 [drp] via OPHTHALMIC
  Filled 2021-03-04: qty 2.5

## 2021-03-04 MED ORDER — DOCUSATE SODIUM 50 MG/5ML PO LIQD
100.0000 mg | Freq: Two times a day (BID) | ORAL | Status: DC
  Administered 2021-03-04 – 2021-03-05 (×3): 100 mg
  Filled 2021-03-04 (×3): qty 10

## 2021-03-04 MED ORDER — FENTANYL CITRATE (PF) 100 MCG/2ML IJ SOLN
50.0000 ug | INTRAMUSCULAR | Status: DC | PRN
  Administered 2021-03-04 – 2021-03-05 (×4): 100 ug via INTRAVENOUS
  Administered 2021-03-05: 200 ug via INTRAVENOUS
  Administered 2021-03-05: 100 ug via INTRAVENOUS
  Administered 2021-03-05: 150 ug via INTRAVENOUS
  Administered 2021-03-05: 200 ug via INTRAVENOUS
  Administered 2021-03-06: 100 ug via INTRAVENOUS
  Filled 2021-03-04: qty 4
  Filled 2021-03-04: qty 2
  Filled 2021-03-04: qty 4
  Filled 2021-03-04 (×2): qty 2
  Filled 2021-03-04: qty 4

## 2021-03-04 MED ORDER — SODIUM CHLORIDE 0.9 % IV SOLN
1.5000 g | Freq: Four times a day (QID) | INTRAVENOUS | Status: DC
  Filled 2021-03-04 (×3): qty 4

## 2021-03-04 MED ORDER — PANTOPRAZOLE SODIUM 40 MG IV SOLR
40.0000 mg | Freq: Every day | INTRAVENOUS | Status: DC
  Administered 2021-03-04 – 2021-03-05 (×2): 40 mg via INTRAVENOUS
  Filled 2021-03-04 (×2): qty 40

## 2021-03-04 MED ORDER — CHLORHEXIDINE GLUCONATE 0.12% ORAL RINSE (MEDLINE KIT)
15.0000 mL | Freq: Two times a day (BID) | OROMUCOSAL | Status: DC
  Administered 2021-03-04 – 2021-03-06 (×4): 15 mL via OROMUCOSAL

## 2021-03-04 MED ORDER — DOCUSATE SODIUM 50 MG/5ML PO LIQD: 100.0000 mg | Freq: Two times a day (BID) | ORAL | Status: DC | PRN

## 2021-03-04 MED ORDER — AMPICILLIN-SULBACTAM SODIUM 3 (2-1) G IJ SOLR
3.0000 g | Freq: Four times a day (QID) | INTRAMUSCULAR | Status: DC
  Administered 2021-03-04 – 2021-03-06 (×8): 3 g via INTRAVENOUS
  Filled 2021-03-04 (×9): qty 8

## 2021-03-04 MED ORDER — ALBUTEROL SULFATE (2.5 MG/3ML) 0.083% IN NEBU
5.0000 mg | INHALATION_SOLUTION | Freq: Once | RESPIRATORY_TRACT | Status: AC
  Administered 2021-03-04: 5 mg via RESPIRATORY_TRACT

## 2021-03-04 MED ORDER — ORAL CARE MOUTH RINSE
15.0000 mL | OROMUCOSAL | Status: DC
  Administered 2021-03-04 – 2021-03-06 (×18): 15 mL via OROMUCOSAL

## 2021-03-04 MED ORDER — PROPOFOL 1000 MG/100ML IV EMUL
0.0000 ug/kg/min | INTRAVENOUS | Status: DC
Start: 2021-03-04 — End: 2021-03-06
  Administered 2021-03-04 – 2021-03-05 (×6): 50 ug/kg/min via INTRAVENOUS
  Administered 2021-03-05: 45 ug/kg/min via INTRAVENOUS
  Administered 2021-03-05: 50 ug/kg/min via INTRAVENOUS
  Administered 2021-03-05: 45 ug/kg/min via INTRAVENOUS
  Administered 2021-03-05 (×2): 50 ug/kg/min via INTRAVENOUS
  Administered 2021-03-05: 45 ug/kg/min via INTRAVENOUS
  Administered 2021-03-05 – 2021-03-06 (×5): 50 ug/kg/min via INTRAVENOUS
  Filled 2021-03-04 (×16): qty 100

## 2021-03-04 MED ORDER — IOHEXOL 350 MG/ML SOLN
100.0000 mL | Freq: Once | INTRAVENOUS | Status: AC | PRN
  Administered 2021-03-04: 100 mL via INTRAVENOUS

## 2021-03-04 MED ORDER — LACTATED RINGERS IV SOLN: INTRAVENOUS | Status: DC

## 2021-03-04 MED ORDER — PROPOFOL 1000 MG/100ML IV EMUL: 5.0000 ug/kg/min | INTRAVENOUS | Status: DC

## 2021-03-04 MED ORDER — ROCURONIUM BROMIDE 50 MG/5ML IV SOLN
100.0000 mg | Freq: Once | INTRAVENOUS | Status: AC
  Administered 2021-03-04: 100 mg via INTRAVENOUS

## 2021-03-04 MED ORDER — POLYETHYLENE GLYCOL 3350 17 G PO PACK: 17.0000 g | PACK | Freq: Every day | ORAL | Status: DC | PRN

## 2021-03-04 NOTE — H&P (Addendum)
NAME:  Lynne Righi, MRN:  [id number], DOB:  [birth year], LOS: 0 ADMISSION DATE:  03/04/2021, CONSULTATION DATE: 7/29 REFERRING MD: Dr. Audley Hose EDP, CHIEF COMPLAINT: Unresponsive  History of Present Illness:  30 year old male with past medical history as below, which is significant for opioid dependence and seizures on Keppra.  The patient presented to [facility] emergency department on 7/29 via EMS after being found unresponsive.  He was laying down in a grocery store parking lot.  Someone saw him at 5 AM laying there, but he was able to awaken and interact so they did not call EMS.  At around 7 AM he was found to be unresponsive at which point EMS was requested.  Upon EMS arrival the patient was indeed found to be unresponsive and was hypoxemic to 73% prompting transfer on nonrebreather.  He was given a dose of Narcan, to which he did have a significant response and was screaming at staff.  Ultimately given the patient's excited state and hypoxemia he required sedation and intubation.  PCCM was asked to evaluate for admission.  Pertinent  Medical History  Opioid dependence Seizures  Significant Hospital Events: Including procedures, antibiotic start and stop dates in addition to other pertinent events   7/29 admit  Interim History / Subjective:    Objective   Blood pressure 108/68, pulse 98, temperature (!) 97.1 F (36.2 C), temperature source Temporal, resp. rate (!) 28, height 6\' 1"  (1.854 m), weight 120 kg, SpO2 100 %.    Vent Mode: PRVC FiO2 (%):  [100 %] 100 % Set Rate:  [20 bmp] 20 bmp Vt Set:  mL] 630 mL PEEP:  [12 cmH20] 12 cmH20  No intake or output data in the 24 hours ending 03/04/21 1130 Filed Weights   03/04/21 0737  Weight: 120 kg    Examination: General: Young adult male of normal body habitus HENT: Normocephalic, atraumatic, injected sclera, dried yellow eye drainage Lungs: Clear bilateral breath sounds Cardiovascular: Regular rate and rhythm, no murmurs rubs  gallops Abdomen: Soft, nontender, hypoactive Extremities: No acute deformity.  No edema.  No evidence of injection sites. Neuro: Sedated, occasionally reaches for ET tube. GU: Foley draining clear yellow urine.  Resolved Hospital Problem list     Assessment & Plan:   Acute encephalopathy: It was seem this this is toxic metabolic encephalopathy secondary to opioid overdose considering his history of heroin use and response to Narcan in the emergency department.  The patient does have a history of seizures and elevated lactic on admission so seizure remains in the differential.  CT of the head unremarkable. -Admit to ICU -Propofol infusion and as needed fentanyl for RASS goal -1 to -2 -EEG -Resume home dose Keppra - UDS pending.   ARDS: secondary to aspirtion pneumonia. PaO2/FiO2 after intubation 59  Aspiration pneumonia: Dense bilateral dependent infiltrates on CT 7/29 -Full vent support -Requiring high PEEP/FiO2, but sats 100% we will repeat ABG and wean for sat goal greater than 90% -based on repeat gas will transition to ARDSnet settings -Unasyn for aspiration -Follow chest x-ray  Pneumomediastinum: Etiology unclear, barotrauma from high PEEP? CT queries esophageal perf.  -Continue to trend chest x-ray, concern with high PEEP requirement.  AKI Hyperkalemia Lactic acidosis - Hydrate and repeat labs - LR 125/hr  Conjunctivitis - ciprofloxacin ophthalmic q2 hours while awake x 2 days than q 4 hours while awake for 5 additional days.   Elevated transaminases: likely shock related considering prolonged poor oxygenation and lactic 8 - trend  Best Practice (right click and "Reselect all SmartList Selections" daily)   Diet/type: NPO DVT prophylaxis: prophylactic heparin  GI prophylaxis: PPI Lines: N/A Foley:  Yes, and it is still needed Code Status:  full code Last date of multidisciplinary goals of care discussion Alexis Goodell not been able to reach family.]  Labs    CBC: Recent Labs  Lab 03/04/21 0836 03/04/21 0935  WBC  --  12.6*  NEUTROABS  --  11.0*  HGB 17.0 15.7  HCT 50.0 49.1  MCV  --  95.3  PLT  --  270    Basic Metabolic Panel: Recent Labs  Lab 03/04/21 0755 03/04/21 0836  NA 133* 131*  K 5.3* 5.5*  CL 99  --   CO2 19*  --   GLUCOSE 289*  --   BUN 18  --   CREATININE 2.05*  --   CALCIUM 8.1*  --    GFR: Estimated Creatinine Clearance: 72.8 mL/min (A) (by C-G formula based on SCr of 2.05 mg/dL (H)). Recent Labs  Lab 03/04/21 0755 03/04/21 0935  WBC  --  12.6*  LATICACIDVEN 8.2*  --     Liver Function Tests: Recent Labs  Lab 03/04/21 0755  AST 157*  ALT 189*  ALKPHOS 92  BILITOT 0.9  PROT 6.9  ALBUMIN 3.7   No results for input(s): LIPASE, AMYLASE in the last 168 hours. No results for input(s): AMMONIA in the last 168 hours.  ABG    Component Value Date/Time   PHART 7.109 (LL) 03/04/2021 0836   PCO2ART 72.4 (HH) 03/04/2021 0836   PO2ART 59 (L) 03/04/2021 0836   HCO3 22.9 03/04/2021 0836   TCO2 25 03/04/2021 0836   ACIDBASEDEF 9.0 (H) 03/04/2021 0836   O2SAT 78.0 03/04/2021 0836     Coagulation Profile: No results for input(s): INR, PROTIME in the last 168 hours.  Cardiac Enzymes: Recent Labs  Lab 03/04/21 0755  CKTOTAL 268    HbA1C: No results found for: HGBA1C  CBG: No results for input(s): GLUCAP in the last 168 hours.  Review of Systems:   Patient is encephalopathic and/or intubated. Therefore history has been obtained from chart review.    Past Medical History:  He,  has a past medical history of Seizures (HCC) and Ureterolithiasis.   Surgical History:  No past surgical history on file.   Social History:   reports that he has never smoked. He uses smokeless tobacco. He reports previous drug use. He reports that he does not drink alcohol.   Family History:  His family history is not on file.   Allergies Allergies  Allergen Reactions   Tramadol Anaphylaxis     Home  Medications  Prior to Admission medications   Medication Sig Start Date End Date Taking? Authorizing Provider  levETIRAcetam (KEPPRA) 500 MG tablet Take 1 tablet (500 mg total) by mouth 2 (two) times daily. 04/11/20   Molpus, John, MD  ondansetron (ZOFRAN ODT) 8 MG disintegrating tablet Take 1 tablet (8 mg total) by mouth every 8 (eight) hours as needed for nausea or vomiting. 07/15/20   Molpus, John, MD  oxyCODONE-acetaminophen (PERCOCET) 10-325 MG tablet Take 1 tablet by mouth every 6 (six) hours as needed for pain. 07/15/20   Molpus, Jonny Ruiz, MD     Critical care time: 42 minutes     Joneen Roach, AGACNP-BC [facility]  See Amion for personal pager PCCM on call pager [phone] until 7pm. Please call Elink 7p-7a. [phone]  03/04/2021 11:48  AM        

## 2021-03-04 NOTE — ED Notes (Signed)
Lelon Frohlich fiance [phone] would like an update on the patient

## 2021-03-04 NOTE — ED Provider Notes (Addendum)
[facility] EMERGENCY DEPARTMENT Provider Note   CSN: [id number] Arrival date & time: 03/04/21  4166     History No chief complaint on file.   Jordan Adams is a 30 y.o. male.  Patient presents from EMS has altered mental status, unresponsive.  They state that they found him in a parking lot, reported by bystanders as appearing unresponsive in the parking lot.  He was repeatedly seen by bystanders around 5 AM awake alert and hanging around the parking lot.  Then they saw him on the ground unresponsive at around 8 AM.  I am unable to obtain full history or review of systems due to the patient's status.  EMS states that they gave him 2 mg of Narcan and the patient appeared to wake up but was very confused.  His O2 sats were very low, they placed him on nonrebreather but were only able to maintain sats to about 70% despite being on nonrebreather.      Past Medical History:  Diagnosis Date   Seizures (HCC)    Ureterolithiasis     There are no problems to display for this patient.   No past surgical history on file.     No family history on file.  Social History   Tobacco Use   Smoking status: Never   Smokeless tobacco: Current  Vaping Use   Vaping Use: Never used  Substance Use Topics   Alcohol use: Never   Drug use: Not Currently    Comment: past heroine user    Home Medications Prior to Admission medications   Medication Sig Start Date End Date Taking? Authorizing Provider  levETIRAcetam (KEPPRA) 500 MG tablet Take 1 tablet (500 mg total) by mouth 2 (two) times daily. 04/11/20   Molpus, John, MD  ondansetron (ZOFRAN ODT) 8 MG disintegrating tablet Take 1 tablet (8 mg total) by mouth every 8 (eight) hours as needed for nausea or vomiting. 07/15/20   Molpus, John, MD  oxyCODONE-acetaminophen (PERCOCET) 10-325 MG tablet Take 1 tablet by mouth every 6 (six) hours as needed for pain. 07/15/20   Molpus, John, MD    Allergies    Tramadol  Review of  Systems   Review of Systems  Unable to perform ROS: Mental status change   Physical Exam Updated Vital Signs BP 108/68   Pulse 98   Temp (!) 97.1 F (36.2 C) (Temporal)   Resp (!) 28   Ht 6\' 1"  (1.854 m)   Wt 120 kg   SpO2 100%   BMI 34.90 kg/m   Physical Exam Constitutional:      Appearance: He is well-developed.     Comments: Patient awake, confused, agitated  HENT:     Head: Normocephalic.     Nose: Nose normal.  Eyes:     Extraocular Movements: Extraocular movements intact.  Cardiovascular:     Rate and Rhythm: Normal rate.  Pulmonary:     Effort: Pulmonary effort is normal.  Skin:    Coloration: Skin is not jaundiced.     Comments: Multiple small bug bites on his torso and extremities.  There were ants still crawling on the patient.  Neurological:     Comments: Awake, agitated and confused.  Eyes closed.  Patient will repeat his name when asked, however when I ask what happened, he is answers "I don't know where I am."    ED Results / Procedures / Treatments   Labs (all labs ordered are listed, but only abnormal results are  displayed) Labs Reviewed  COMPREHENSIVE METABOLIC PANEL - Abnormal; Notable for the following components:      Result Value   Sodium 133 (*)    Potassium 5.3 (*)    CO2 19 (*)    Glucose, Bld 289 (*)    Creatinine, Ser 2.05 (*)    Calcium 8.1 (*)    AST 157 (*)    ALT 189 (*)    GFR, Estimated 44 (*)    All other components within normal limits  SALICYLATE LEVEL - Abnormal; Notable for the following components:   Salicylate Lvl <7.0 (*)    All other components within normal limits  LACTIC ACID, PLASMA - Abnormal; Notable for the following components:   Lactic Acid, Venous 8.2 (*)    All other components within normal limits  ACETAMINOPHEN LEVEL - Abnormal; Notable for the following components:   Acetaminophen (Tylenol), Serum <10 (*)    All other components within normal limits  CBC WITH DIFFERENTIAL/PLATELET - Abnormal; Notable  for the following components:   WBC 12.6 (*)    Neutro Abs 11.0 (*)    All other components within normal limits  I-STAT ARTERIAL BLOOD GAS, ED - Abnormal; Notable for the following components:   pH, Arterial 7.109 (*)    pCO2 arterial 72.4 (*)    pO2, Arterial 59 (*)    Acid-base deficit 9.0 (*)    Sodium 131 (*)    Potassium 5.5 (*)    All other components within normal limits  TROPONIN I (HIGH SENSITIVITY) - Abnormal; Notable for the following components:   Troponin I (High Sensitivity) 32 (*)    All other components within normal limits  TROPONIN I (HIGH SENSITIVITY) - Abnormal; Notable for the following components:   Troponin I (High Sensitivity) 37 (*)    All other components within normal limits  RESP PANEL BY RT-PCR (FLU A&B, COVID) ARPGX2  BRAIN NATRIURETIC PEPTIDE  ETHANOL  CK  CBC WITH DIFFERENTIAL/PLATELET  LACTIC ACID, PLASMA  BLOOD GAS, ARTERIAL  URINALYSIS, ROUTINE W REFLEX MICROSCOPIC  RAPID URINE DRUG SCREEN, HOSP PERFORMED    EKG EKG Interpretation  Date/Time:  Friday March 04 2021 07:49:36 EDT Ventricular Rate:  134 PR Interval:  149 QRS Duration: 98 QT Interval:  309 QTC Calculation: 462 R Axis:   87 Text Interpretation: Sinus tachycardia Probable inferior infarct, old Anterolateral infarct, age indeterminate Confirmed by Norman Clay ([id number]) on 03/04/2021 7:52:30 AM  Radiology DG Chest Portable 1 View  Result Date: 03/04/2021 CLINICAL DATA:  30 year old male found down in parking lot unresponsive. Responded some to initial dose of naloxone. Intubated. EXAM: PORTABLE CHEST 1 VIEW COMPARISON:  CT Chest, Abdomen, and Pelvis 08/23/2020 and earlier. FINDINGS: Portable AP semi upright view at 0746 hours. Endotracheal tube tip in good position between the clavicles and carina. Enteric tube courses to the abdomen and loops in the left upper quadrant with side hole visible at the level of the gastric body. Acute upper lung predominant and perihilar patchy and  indistinct symmetric increased opacity. No superimposed pneumothorax, pleural effusion or consolidation. Lung bases remain relatively clear. Normal cardiac size and mediastinal contours. Negative visible bowel gas. No acute osseous abnormality identified. IMPRESSION: 1. Endotracheal tube tip in good position. Enteric tube placed into the stomach with side hole at the level of the gastric body. 2. Upper lobe and perihilar predominant indistinct pulmonary opacity, favor noncardiogenic pulmonary edema in this setting. Electronically Signed   By: Odessa Fleming M.D.   On: 03/04/2021 08:12  DG Abd Portable 1 View  Result Date: 03/04/2021 CLINICAL DATA:  30 year old male enteric tube placement. EXAM: PORTABLE ABDOMEN - 1 VIEW COMPARISON:  Portable chest x-ray the same time. CT Chest, Abdomen, and Pelvis 08/23/2020. FINDINGS: Portable AP semi upright view at 0750 hours. Enteric tube placed into the stomach with side hole at the level of the gastric body. Mild gas-filled transverse colon, but overall nonobstructed pattern. Left lung base appears negative. No acute osseous abnormality identified. IMPRESSION: Enteric tube in the stomach, side hole at the level of the gastric body. Electronically Signed   By: Odessa Fleming M.D.   On: 03/04/2021 08:14    Procedures Procedure Name: Intubation Date/Time: 03/04/2021 7:58 AM Performed by: Cheryll Cockayne, MD Comments: Intubated with 7.5 ET tube and glide scope.  Unable to bag the patient of higher than 75 to 80% via bag valve mask. Cords were visualized and patient intubated on first pass success.  Good color change on the end-tidal color monitor.  O2 sats dropped to about 26% however, before being brought back up to about 87% on the vent.    .Critical Care  Date/Time: 03/04/2021 11:12 AM Performed by: Cheryll Cockayne, MD Authorized by: Cheryll Cockayne, MD   Critical care provider statement:    Critical care time (minutes):  45   Critical care was necessary to treat or  prevent imminent or life-threatening deterioration of the following conditions:  Respiratory failure and CNS failure or compromise   Critical care was time spent personally by me on the following activities:  Discussions with consultants, evaluation of patient's response to treatment, examination of patient, ordering and performing treatments and interventions, ordering and review of laboratory studies, ordering and review of radiographic studies, pulse oximetry, re-evaluation of patient's condition, obtaining history from patient or surrogate and review of old charts   Medications Ordered in ED Medications  propofol (DIPRIVAN) 1000 MG/100ML infusion (30 mcg/kg/min  120 kg Intravenous New Bag/Given 03/04/21 0745)  sodium chloride 0.9 % bolus 1,000 mL (has no administration in time range)  propofol (DIPRIVAN) 1000 MG/100ML infusion (has no administration in time range)  etomidate (AMIDATE) injection 20 mg (20 mg Intravenous Given 03/04/21 0737)  rocuronium (ZEMURON) injection 100 mg (100 mg Intravenous Given 03/04/21 0736)  sodium chloride 0.9 % bolus 1,000 mL (1,000 mLs Intravenous Bolus 03/04/21 0907)  propofol (DIPRIVAN) bolus via infusion 50 mg (50 mg Intravenous Bolus from Bag 03/04/21 0757)  albuterol (PROVENTIL) (2.5 MG/3ML) 0.083% nebulizer solution 5 mg (5 mg Nebulization Given 03/04/21 0848)  iohexol (OMNIPAQUE) 350 MG/ML injection 100 mL (100 mLs Intravenous Contrast Given 03/04/21 1038)    ED Course  I have reviewed the triage vital signs and the nursing notes.  Pertinent labs & imaging results that were available during my care of the patient were reviewed by me and considered in my medical decision making (see chart for details).    MDM Rules/Calculators/A&P                           Patient arrived into the trauma bay brought in by EMS.  He is moderately obtunded, agitated with verbal and hypoxemic.  Heart rate is tachycardic blood pressure is hypertensive and O2 saturation is 60 to  70% despite being on nonrebreather at 15 L.  Patient intubated for hypoxemia.  Attempted to call his significant other listed as a contact on his demographic sheet but there was no answer.  Given his history  of seizure disorder, I suspect he may have had a seizure while in the parking lot and perhaps he aspirated resulting in hypoxemia.  His lactic acid is elevated 8 here today but is afebrile has no other infectious etiology.  Antibiotics are held for now 2 L bolus of IV fluids given.  CT scan of his brain appears unremarkable with no midline shift no large bleed noted pending radiologist final read.  CT angio of the chest also pending.  Patient remains intubated, ICU consulted for admission.   Final Clinical Impression(s) / ED Diagnoses Final diagnoses:  Acute respiratory failure with hypoxia (HCC)  Acute encephalopathy    Rx / DC Orders ED Discharge Orders     None        Cheryll CockayneHong, Ajeet Casasola S, MD 03/04/21 1112    Cheryll CockayneHong, Keyion Knack S, MD 03/04/21 1158

## 2021-03-04 NOTE — Progress Notes (Signed)
  Date and time results received: 03/04/21 1555   Test: Lactic Acid Critical Value: 2.5    Name of Provider Notified: Dr. Celine Mans  Orders Received? Or Actions Taken?: see orders

## 2021-03-04 NOTE — ED Triage Notes (Signed)
Patient BIB [facility] after being found unresponsive in a [employer] parking lot. Bystanders state they had seen the patient at 0500 in same parking lot but he was arousable to they let him sleep, but then this morning he was unresponsive. Patient given 3mg  nalaxone by EMS PTA and patient became responsive to painful stimuli. On arrival to ED patient responsive to painful stimuli, SpO2 73% on NRB.

## 2021-03-04 NOTE — Progress Notes (Signed)
Pharmacy Antibiotic Note  Jordan Adams is a 30 y.o. male admitted on 03/04/2021 with  aspiration PNA .  Pharmacy has been consulted for unasyn dosing.  Plan: Unasyn 1.5g IV q6h -Monitor renal function, clinical status, and antibiotic plan  Height: 6\' 1"  (185.4 cm) Weight: 120 kg (264 lb 8.8 oz) IBW/kg (Calculated) : 79.9  Temp (24hrs), Avg:97.1 F (36.2 C), Min:97.1 F (36.2 C), Max:97.1 F (36.2 C)  Recent Labs  Lab 03/04/21 0755 03/04/21 0935  WBC  --  12.6*  CREATININE 2.05*  --   LATICACIDVEN 8.2*  --     Estimated Creatinine Clearance: 72.8 mL/min (A) (by C-G formula based on SCr of 2.05 mg/dL (H)).    Allergies  Allergen Reactions   Tramadol Anaphylaxis, Itching and Swelling    Other reaction(s): Respiratory Distress (ALLERGY/intolerance)   Ketorolac Rash and Swelling    Also "puffy"     Antimicrobials this admission  Unasyn 7/29 >>   Thank you for allowing pharmacy to be a part of this patient's care.  8/29, PharmD, [facility] Emergency Medicine Clinical Pharmacist ED RPh Phone: [phone] Main RX: [phone]

## 2021-03-04 NOTE — Progress Notes (Signed)
Pt transported from TRA A to 3M04 without any complications.

## 2021-03-04 NOTE — Procedures (Addendum)
Patient Name: Jordan Adams  MRN: [id number]  Epilepsy Attending: Charlsie Quest  Referring Physician/Provider: Joneen Roach, NP Date: 03/04/2021 Duration: 26.41 mins  Patient history: 30 yo man with history of heroin use who presents after being found down in grocery store parking lot. EEG to evaluate for seizure.  Level of alertness:  comatose  AEDs during EEG study: LEV, propofol  Technical aspects: This EEG study was done with scalp electrodes positioned according to the 10-20 International system of electrode placement. Electrical activity was acquired at a sampling rate of 500Hz  and reviewed with a high frequency filter of 70Hz  and a low frequency filter of 1Hz . EEG data were recorded continuously and digitally stored.   Description: EEG showed continuous generalized 2-3Hz  delta slowing admixed with an excessive amount of 15 to 18 Hz beta activity distributed symmetrically and diffusely. Hyperventilation and photic stimulation were not performed.     ABNORMALITY - Continuous slow, generalized - Excessive beta, generalized  IMPRESSION: This study is suggestive of moderate to severe diffuse encephalopathy, nonspecific etiology but likely related to sedation. The excessive beta activity seen in the background is most likely due to the effect of benzodiazepine and is a benign EEG pattern. No seizures or epileptiform discharges were seen throughout the recording.   Jordan Adams 

## 2021-03-04 NOTE — Progress Notes (Signed)
eLink Physician-Brief Progress Note Patient Name: Deaire Mcwhirter DOB: 09-21-1990 MRN: [id number]   Date of Service  03/04/2021  HPI/Events of Note  Request for hydrocortisone cream for bug bites  eICU Interventions  Hydrocortisone cream 1% ordered     Intervention Category Minor Interventions: Routine modifications to care plan (e.g. PRN medications for pain, fever)  Rosalie Gums Amberlie Gaillard 03/04/2021, 8:43 PM

## 2021-03-04 NOTE — ED Notes (Signed)
Patient intubated after being found down in a parking lot this am. Patient sedated on Propofol. Patient in NAD at this time.

## 2021-03-04 NOTE — Progress Notes (Signed)
Initial Nutrition Assessment  DOCUMENTATION CODES:   Obesity unspecified  INTERVENTION:   When medically feasible, recommend initiation of enteral nutrition via OG tube: - Vital High Protein @ 40 ml/hr (960 ml/day) - ProSource TF 90 ml QID  Recommended tube feeding regimen would provide 1280 kcal, 172 grams of protein, and 803 ml of H2O.   Recommended tube feeding regimen and current propofol would provide 2230 total kcal (>100% of needs)  NUTRITION DIAGNOSIS:   Inadequate oral intake related to inability to eat as evidenced by NPO status.  GOAL:   Provide needs based on ASPEN/SCCM guidelines  MONITOR:   Vent status, Labs, Weight trends, I & O's  REASON FOR ASSESSMENT:   Ventilator    ASSESSMENT:   30 year old male who presented to the ED on 7/29 after being found unresponsive in a grocery store parking lot. Pt required intubation. PMH of heroin abuse, seizure disorder. Pt admitted with acute encephalopathy, ARDS secondary to aspiration PNA, AKI.  Pt with OG tube in stomach per abdominal x-ray today. RD will leave tube feeding recommendations for use when appropriate.  No family present at time of RD visit. Unable to obtain diet and weight history at this time.  Reviewed weight history in chart. Weight trending up over the last 10 months.  Patient is currently intubated on ventilator support MV: 10 L/min Temp (24hrs), Avg:98.4 F (36.9 C), Min:97.1 F (36.2 C), Max:99.7 F (37.6 C)  Drips: Propofol: 36 ml/hr (provides 950 kcal daily from lipid) LR: 125 ml/hr  Medications reviewed and include: colace, IV protonix, miralax, IV abx  Labs reviewed: sodium 134, creatinine 1.40, ionized calcium 1.04, elevated LFTs, lactic acid 8.2  NUTRITION - FOCUSED PHYSICAL EXAM:  Flowsheet Row Most Recent Value  Orbital Region No depletion  Upper Arm Region No depletion  Thoracic and Lumbar Region No depletion  Buccal Region Unable to assess  Temple Region No depletion   Clavicle Bone Region No depletion  Clavicle and Acromion Bone Region No depletion  Scapular Bone Region Unable to assess  Dorsal Hand No depletion  Patellar Region No depletion  Anterior Thigh Region No depletion  Posterior Calf Region No depletion  Edema (RD Assessment) None  Hair Reviewed  Eyes Unable to assess  Mouth Unable to assess  Skin Reviewed  Nails Reviewed       Diet Order:   Diet Order             Diet NPO time specified  Diet effective now                   EDUCATION NEEDS:   No education needs have been identified at this time  Skin:  Skin Assessment: Reviewed RN Assessment  Last BM:  no documented BM  Height:   Ht Readings from Last 1 Encounters:  03/04/21 6\' 1"  (1.854 m)    Weight:   Wt Readings from Last 1 Encounters:  03/04/21 120 kg    Ideal Body Weight:  83.6 kg  BMI:  Body mass index is 34.9 kg/m.  Estimated Nutritional Needs:   Kcal:  03/06/21  Protein:  165-185 grams  Fluid:  >/= 1.5 L    [id number], MS, RD, LDN Inpatient Clinical Dietitian Please see AMiON for contact information.

## 2021-03-04 NOTE — Progress Notes (Signed)
EEG complete - results pending 

## 2021-03-04 NOTE — Progress Notes (Signed)
Pt transported from TRA A to CT and back without any complications.

## 2021-03-05 ENCOUNTER — Inpatient Hospital Stay (HOSPITAL_COMMUNITY): Payer: Self-pay

## 2021-03-05 DIAGNOSIS — T402X1D Poisoning by other opioids, accidental (unintentional), subsequent encounter: Secondary | ICD-10-CM

## 2021-03-05 LAB — CBC
HCT: 39.3 % (ref 39.0–52.0)
Hemoglobin: 13.6 g/dL (ref 13.0–17.0)
MCH: 30.8 pg (ref 26.0–34.0)
MCHC: 34.6 g/dL (ref 30.0–36.0)
MCV: 88.9 fL (ref 80.0–100.0)
Platelets: 183 10*3/uL (ref 150–400)
RBC: 4.42 MIL/uL (ref 4.22–5.81)
RDW: 12.4 % (ref 11.5–15.5)
WBC: 10.5 10*3/uL (ref 4.0–10.5)
nRBC: 0 % (ref 0.0–0.2)

## 2021-03-05 LAB — BASIC METABOLIC PANEL
Anion gap: 7 (ref 5–15)
BUN: 14 mg/dL (ref 6–20)
CO2: 23 mmol/L (ref 22–32)
Calcium: 8 mg/dL — ABNORMAL LOW (ref 8.9–10.3)
Chloride: 107 mmol/L (ref 98–111)
Creatinine, Ser: 0.95 mg/dL (ref 0.61–1.24)
GFR, Estimated: >60 mL/min (ref >60)
Glucose, Bld: 101 mg/dL — ABNORMAL HIGH (ref 70–99)
Potassium: 3 mmol/L — ABNORMAL LOW (ref 3.5–5.1)
Sodium: 137 mmol/L (ref 135–145)

## 2021-03-05 LAB — GLUCOSE, CAPILLARY
Glucose-Capillary: 110 mg/dL — ABNORMAL HIGH (ref 70–99)
Glucose-Capillary: 88 mg/dL (ref 70–99)
Glucose-Capillary: 86 mg/dL (ref 70–99)
Glucose-Capillary: 93 mg/dL (ref 70–99)
Glucose-Capillary: 95 mg/dL (ref 70–99)
Glucose-Capillary: 95 mg/dL (ref 70–99)

## 2021-03-05 LAB — POCT I-STAT 7, (LYTES, BLD GAS, ICA,H+H)
Acid-Base Excess: 2 mmol/L (ref 0.0–2.0)
Acid-Base Excess: 0 mmol/L (ref 0.0–2.0)
Bicarbonate: 22.8 mmol/L (ref 20.0–28.0)
Bicarbonate: 25.6 mmol/L (ref 20.0–28.0)
Calcium, Ion: 1.1 mmol/L — ABNORMAL LOW (ref 1.15–1.40)
Calcium, Ion: 1.15 mmol/L (ref 1.15–1.40)
HCT: 37 % — ABNORMAL LOW (ref 39.0–52.0)
HCT: 38 % — ABNORMAL LOW (ref 39.0–52.0)
Hemoglobin: 12.6 g/dL — ABNORMAL LOW (ref 13.0–17.0)
Hemoglobin: 12.9 g/dL — ABNORMAL LOW (ref 13.0–17.0)
O2 Saturation: 98 %
O2 Saturation: 99 %
Patient temperature: 97.8
Patient temperature: 97.8
Potassium: 3.1 mmol/L — ABNORMAL LOW (ref 3.5–5.1)
Potassium: 3.7 mmol/L (ref 3.5–5.1)
Sodium: 138 mmol/L (ref 135–145)
Sodium: 141 mmol/L (ref 135–145)
TCO2: 23 mmol/L (ref 22–32)
TCO2: 27 mmol/L (ref 22–32)
pCO2 arterial: 22.9 mmHg — ABNORMAL LOW (ref 32.0–48.0)
pCO2 arterial: 41.6 mmHg (ref 32.0–48.0)
pH, Arterial: 7.603 (ref 7.350–7.450)
pH, Arterial: 7.396 (ref 7.350–7.450)
pO2, Arterial: 86 mmHg (ref 83.0–108.0)
pO2, Arterial: 125 mmHg — ABNORMAL HIGH (ref 83.0–108.0)

## 2021-03-05 LAB — PHOSPHORUS
Phosphorus: 1.8 mg/dL — ABNORMAL LOW (ref 2.5–4.6)
Phosphorus: 2.8 mg/dL (ref 2.5–4.6)
Phosphorus: 2.4 mg/dL — ABNORMAL LOW (ref 2.5–4.6)

## 2021-03-05 LAB — MAGNESIUM
Magnesium: 1.9 mg/dL (ref 1.7–2.4)
Magnesium: 2.7 mg/dL — ABNORMAL HIGH (ref 1.7–2.4)
Magnesium: 2.2 mg/dL (ref 1.7–2.4)

## 2021-03-05 LAB — TRIGLYCERIDES: Triglycerides: 68 mg/dL (ref <150)

## 2021-03-05 MED ORDER — POTASSIUM CHLORIDE 20 MEQ PO PACK
20.0000 meq | PACK | Freq: Once | ORAL | Status: AC
  Administered 2021-03-05: 20 meq
  Filled 2021-03-05: qty 1

## 2021-03-05 MED ORDER — THIAMINE HCL 100 MG/ML IJ SOLN
100.0000 mg | Freq: Every day | INTRAMUSCULAR | Status: DC
  Administered 2021-03-05 – 2021-03-06 (×2): 100 mg via INTRAVENOUS
  Filled 2021-03-05 (×2): qty 2

## 2021-03-05 MED ORDER — POTASSIUM PHOSPHATES 15 MMOLE/5ML IV SOLN
30.0000 mmol | Freq: Once | INTRAVENOUS | Status: AC
  Administered 2021-03-05: 30 mmol via INTRAVENOUS
  Filled 2021-03-05: qty 10

## 2021-03-05 MED ORDER — VITAL HIGH PROTEIN PO LIQD
1000.0000 mL | ORAL | Status: DC
  Administered 2021-03-05: 1000 mL
  Filled 2021-03-05: qty 1000

## 2021-03-05 MED ORDER — POTASSIUM CHLORIDE 20 MEQ PO PACK: 40.0000 meq | PACK | ORAL | Status: DC

## 2021-03-05 MED ORDER — MAGNESIUM SULFATE 2 GM/50ML IV SOLN
2.0000 g | Freq: Once | INTRAVENOUS | Status: AC
  Administered 2021-03-05: 2 g via INTRAVENOUS
  Filled 2021-03-05: qty 50

## 2021-03-05 MED ORDER — PROSOURCE TF PO LIQD
45.0000 mL | Freq: Two times a day (BID) | ORAL | Status: DC
  Administered 2021-03-05 (×2): 45 mL
  Filled 2021-03-05 (×2): qty 45

## 2021-03-05 MED ORDER — FENTANYL 2500MCG IN NS 250ML (10MCG/ML) PREMIX INFUSION
0.0000 ug/h | INTRAVENOUS | Status: DC
  Administered 2021-03-05: 25 ug/h via INTRAVENOUS
  Filled 2021-03-05: qty 250

## 2021-03-05 NOTE — Progress Notes (Signed)
[facility] ADULT ICU REPLACEMENT PROTOCOL   The patient does apply for the [facility] Adult ICU Electrolyte Replacment Protocol based on the criteria listed below:   1.Exclusion criteria: TCTS patients, ECMO patients and Hypothermia Protocol, and   Dialysis patients 2. Is GFR >/= 30 ml/min? Yes.    Patient's GFR today is >60 3. Is SCr </= 2? No. Patient's SCr is 0.95 mg/dL 4. Did SCr increase >/= 0.5 in 24 hours? No. 5.Pt's weight >40kg  Yes.   6. Abnormal electrolyte(s): K+ 3.0, phos 1.8, mag 1.9  7. Electrolytes replaced per protocol 8.  Call MD STAT for K+ </= 2.5, Phos </= 1, or Mag </= 1 Physician:  n/a  Jordan Adams 03/05/2021 6:15 AM

## 2021-03-05 NOTE — Progress Notes (Signed)
eLink Physician-Brief Progress Note Patient Name: Jordan Adams DOB: 07-13-1991 MRN: [id number]   Date of Service  03/05/2021  HPI/Events of Note  Notified of ABG result 7.01/27/85 On 28/630 (8 cc/kg IBW), 40%/5 PEEP MV 17, patient not breathing over set rate Peak pressure 25  eICU Interventions  Discussed with bedside RT Decrease TV to 7 cc/kg IBW and target 6 cc Decrease rate to 18 ABG in 2 hours     Intervention Category Intermediate Interventions: Diagnostic test evaluation  Darl Pikes 03/05/2021, 4:48 AM

## 2021-03-05 NOTE — Progress Notes (Addendum)
NAME:  Jordan Adams, MRN:  [id number], DOB:  07/08/1991, LOS: 1 ADMISSION DATE:  03/04/2021, CONSULTATION DATE: 7/29 REFERRING MD: Dr. Audley Hose EDP, CHIEF COMPLAINT: Unresponsive  History of Present Illness:  30 year old male with past medical history as below, which is significant for opioid dependence and seizures on Keppra.  The patient presented to [facility] emergency department on 7/29 via EMS after being found unresponsive.  He was laying down in a grocery store parking lot.  Someone saw him at 5 AM laying there, but he was able to awaken and interact so they did not call EMS.  At around 7 AM he was found to be unresponsive at which point EMS was requested.  Upon EMS arrival the patient was indeed found to be unresponsive and was hypoxemic to 73% prompting transfer on nonrebreather.  He was given a dose of Narcan, to which he did have a significant response and was screaming at staff.  Ultimately given the patient's excited state and hypoxemia he required sedation and intubation.  PCCM was asked to evaluate for admission.  Pertinent  Medical History  Opioid dependence Seizures  Significant Hospital Events: Including procedures, antibiotic start and stop dates in addition to other pertinent events   7/29 admit, intubated  Interim History / Subjective:   No acute events overnight. Patient agitated with weaning of sedation this AM. Not following commands.   Patient's mother and step father at the bedside  Objective   Blood pressure 122/72, pulse 77, temperature 98 F (36.7 C), temperature source Oral, resp. rate 18, height 6\' 1"  (1.854 m), weight 91.7 kg, SpO2 99 %.    Vent Mode: PRVC FiO2 (%):  [40 %-100 %] 40 % Set Rate:  [18 bmp-28 bmp] 18 bmp Vt Set:  [550 mL-630 mL] 550 mL PEEP:  [5 cmH20] 5 cmH20 Plateau Pressure:  [14 cmH20-20 cmH20] 14 cmH20   Intake/Output Summary (Last 24 hours) at 03/05/2021 0908 Last data filed at 03/05/2021 0700 Gross per 24 hour  Intake 3506.05 ml   Output 3680 ml  Net -173.95 ml   Filed Weights   03/04/21 0737 03/05/21 0224  Weight: 120 kg 91.7 kg    Examination: General: young male, no acute distress, intubated/sedated HENT: Normocephalic, atraumatic, sclera anicteric Lungs: course breath sounds bilaterally Cardiovascular: Regular rate and rhythm, no murmurs rubs gallops Abdomen: Soft, nontender, hypoactive Extremities: No acute deformity.  No edema.  No evidence of injection sites. Neuro: Sedated GU: Foley draining clear yellow urine.  Resolved Hospital Problem list     Assessment & Plan:   Acute encephalopathy:  In setting of narcotic overdose and respiratory failure - Wean sedation as able, will monitor agitation -Propofol infusion and as needed fentanyl for RASS goal 0 to -1 -EEG is negative for seizure activity -Resume home dose Keppra - UDS positive for opiates  ARDS: secondary to aspirtion pneumonia. PaO2/FiO2 after intubation 59  Aspiration pneumonia: Dense bilateral dependent infiltrates on CT 7/29 -Full vent support -Oxygenation improved with high PEEP strategy for lung recruitment, now weaned to PEEP of 5 -Unasyn for aspiration  Pneumomediastinum: Etiology unclear, barotrauma from high PEEP vs intubation issue. CT queries esophageal perf.  - No significant worsening on cxr - Will continue to monitor  AKI Hyperkalemia Lactic acidosis Hypomagnesemia Hypophosphatemia - Improved, will stop LR and bolus as needed - replete electrolytes PRN  Conjunctivitis - ciprofloxacin ophthalmic q2 hours while awake x 2 days than q 4 hours while awake for 5 additional days.   Elevated transaminases:  likely shock related considering prolonged poor oxygenation and lactic 8 - trend   Best Practice (right click and "Reselect all SmartList Selections" daily)   Diet/type: tubefeeds DVT prophylaxis: prophylactic heparin  GI prophylaxis: PPI Lines: N/A Foley:  removal ordered  Code Status:  full code Last date  of multidisciplinary goals of care discussion: 7/30, spoke with patient's mother at the bedside, will continue full spectrum of care.  Labs   CBC: Recent Labs  Lab 03/04/21 0935 03/04/21 1220 03/05/21 0428 03/05/21 0455 03/05/21 0649  WBC 12.6*  --   --  10.5  --   NEUTROABS 11.0*  --   --   --   --   HGB 15.7 15.6 12.6* 13.6 12.9*  HCT 49.1 46.0 37.0* 39.3 38.0*  MCV 95.3  --   --  88.9  --   PLT 270  --   --  183  --     Basic Metabolic Panel: Recent Labs  Lab 03/04/21 0755 03/04/21 0836 03/04/21 1220 03/04/21 1450 03/05/21 0428 03/05/21 0455 03/05/21 0649  NA 133*   < > 133* 134* 138 137 141  K 5.3*   < > 5.1 4.4 3.1* 3.0* 3.7  CL 99  --   --  104  --  107  --   CO2 19*  --   --  21*  --  23  --   GLUCOSE 289*  --   --  106*  --  101*  --   BUN 18  --   --  19  --  14  --   CREATININE 2.05*  --   --  1.40*  --  0.95  --   CALCIUM 8.1*  --   --  7.6*  --  8.0*  --   MG  --   --   --   --   --  1.9  --   PHOS  --   --   --   --   --  1.8*  --    < > = values in this interval not displayed.   GFR: Estimated Creatinine Clearance: 130.8 mL/min (by C-G formula based on SCr of 0.95 mg/dL). Recent Labs  Lab 03/04/21 0755 03/04/21 0935 03/04/21 1450 03/04/21 1822 03/05/21 0455  WBC  --  12.6*  --   --  10.5  LATICACIDVEN 8.2*  --  2.5* 2.1*  --     Liver Function Tests: Recent Labs  Lab 03/04/21 0755  AST 157*  ALT 189*  ALKPHOS 92  BILITOT 0.9  PROT 6.9  ALBUMIN 3.7   No results for input(s): LIPASE, AMYLASE in the last 168 hours. No results for input(s): AMMONIA in the last 168 hours.  ABG    Component Value Date/Time   PHART 7.396 03/05/2021 0649   PCO2ART 41.6 03/05/2021 0649   PO2ART 125 (H) 03/05/2021 0649   HCO3 25.6 03/05/2021 0649   TCO2 27 03/05/2021 0649   ACIDBASEDEF 4.0 (H) 03/04/2021 1220   O2SAT 99.0 03/05/2021 0649     Coagulation Profile: No results for input(s): INR, PROTIME in the last 168 hours.  Cardiac Enzymes: Recent  Labs  Lab 03/04/21 0755  CKTOTAL 268    HbA1C: No results found for: HGBA1C  CBG: Recent Labs  Lab 03/04/21 1514 03/04/21 1959 03/04/21 2312 03/05/21 0345 03/05/21 0707  GLUCAP 100* 125* 110* 110* 88     Critical care time: 45 minutes    Melody Comas, MD [facility] Office: [phone]   See Amion for personal pager PCCM on call pager [phone] until 7pm. Please call Elink 7p-7a. [phone]

## 2021-03-05 NOTE — Progress Notes (Signed)
Received patient from Emergency Department with ankle monitor. Police have not contacted the unit.

## 2021-03-06 LAB — COMPREHENSIVE METABOLIC PANEL
ALT: 194 U/L — ABNORMAL HIGH (ref 0–44)
AST: 75 U/L — ABNORMAL HIGH (ref 15–41)
Albumin: 2.8 g/dL — ABNORMAL LOW (ref 3.5–5.0)
Alkaline Phosphatase: 55 U/L (ref 38–126)
Anion gap: 7 (ref 5–15)
BUN: 14 mg/dL (ref 6–20)
CO2: 26 mmol/L (ref 22–32)
Calcium: 7.9 mg/dL — ABNORMAL LOW (ref 8.9–10.3)
Chloride: 107 mmol/L (ref 98–111)
Creatinine, Ser: 0.84 mg/dL (ref 0.61–1.24)
GFR, Estimated: >60 mL/min (ref >60)
Glucose, Bld: 99 mg/dL (ref 70–99)
Potassium: 3.6 mmol/L (ref 3.5–5.1)
Sodium: 140 mmol/L (ref 135–145)
Total Bilirubin: 0.7 mg/dL (ref 0.3–1.2)
Total Protein: 5.7 g/dL — ABNORMAL LOW (ref 6.5–8.1)

## 2021-03-06 LAB — GLUCOSE, CAPILLARY
Glucose-Capillary: 103 mg/dL — ABNORMAL HIGH (ref 70–99)
Glucose-Capillary: 97 mg/dL (ref 70–99)
Glucose-Capillary: 97 mg/dL (ref 70–99)

## 2021-03-06 LAB — MAGNESIUM: Magnesium: 2 mg/dL (ref 1.7–2.4)

## 2021-03-06 LAB — PHOSPHORUS: Phosphorus: 1.6 mg/dL — ABNORMAL LOW (ref 2.5–4.6)

## 2021-03-06 MED ORDER — POTASSIUM PHOSPHATES 15 MMOLE/5ML IV SOLN
45.0000 mmol | Freq: Once | INTRAVENOUS | Status: AC
  Administered 2021-03-06: 45 mmol via INTRAVENOUS
  Filled 2021-03-06: qty 15

## 2021-03-06 MED ORDER — ADULT MULTIVITAMIN W/MINERALS CH
1.0000 | ORAL_TABLET | Freq: Every day | ORAL | Status: DC
  Administered 2021-03-07: 1 via ORAL
  Filled 2021-03-06: qty 1

## 2021-03-06 MED ORDER — ONDANSETRON 4 MG PO TBDP
4.0000 mg | ORAL_TABLET | Freq: Three times a day (TID) | ORAL | Status: DC | PRN
  Administered 2021-03-06: 4 mg via ORAL
  Filled 2021-03-06: qty 1

## 2021-03-06 MED ORDER — PROSOURCE TF PO LIQD: 45.0000 mL | Freq: Two times a day (BID) | ORAL | Status: DC

## 2021-03-06 MED ORDER — LEVETIRACETAM 500 MG PO TABS
500.0000 mg | ORAL_TABLET | Freq: Two times a day (BID) | ORAL | Status: DC
  Administered 2021-03-06 – 2021-03-07 (×2): 500 mg via ORAL
  Filled 2021-03-06 (×2): qty 1

## 2021-03-06 MED ORDER — NICOTINE 21 MG/24HR TD PT24
21.0000 mg | MEDICATED_PATCH | Freq: Every day | TRANSDERMAL | Status: DC
  Filled 2021-03-06: qty 1

## 2021-03-06 MED ORDER — VITAL HIGH PROTEIN PO LIQD: 1000.0000 mL | ORAL | Status: DC

## 2021-03-06 MED ORDER — AMOXICILLIN-POT CLAVULANATE 875-125 MG PO TABS
1.0000 | ORAL_TABLET | Freq: Two times a day (BID) | ORAL | Status: DC
  Administered 2021-03-06 – 2021-03-07 (×2): 1 via ORAL
  Filled 2021-03-06 (×2): qty 1

## 2021-03-06 MED ORDER — PANTOPRAZOLE SODIUM 40 MG PO TBEC
40.0000 mg | DELAYED_RELEASE_TABLET | Freq: Every day | ORAL | Status: DC
  Administered 2021-03-06: 40 mg via ORAL
  Filled 2021-03-06: qty 1

## 2021-03-06 MED ORDER — ENSURE ENLIVE PO LIQD: 237.0000 mL | Freq: Two times a day (BID) | ORAL | Status: DC

## 2021-03-06 NOTE — Progress Notes (Signed)
Pt refused 1200 assessment. Pt states "I don't want to be assessed." Pt threatens to leave AMA. Another RN attempted to assess patient and still refused. MD notified of request to leave AMA and awaiting psych evaluation.

## 2021-03-06 NOTE — Progress Notes (Signed)
[facility] ADULT ICU REPLACEMENT PROTOCOL   The patient does apply for the [facility] Adult ICU Electrolyte Replacment Protocol based on the criteria listed below:   1.Exclusion criteria: TCTS patients, ECMO patients and Hypothermia Protocol, and   Dialysis patients 2. Is GFR >/= 30 ml/min? Yes.    Patient's GFR today is >60 3. Is SCr </= 2? Yes.   Patient's SCr is 0.84 mg/dL 4. Did SCr increase >/= 0.5 in 24 hours? No. 5.Pt's weight >40kg  Yes.   6. Abnormal electrolyte(s): K+ 3.6, Phos 1.6  7. Electrolytes replaced per protocol 8.  Call MD STAT for K+ </= 2.5, Phos </= 1, or Mag </= 1 Physician:  n/a   Jordan Adams 03/06/2021 6:26 AM

## 2021-03-06 NOTE — Progress Notes (Signed)
eLink Physician-Brief Progress Note Patient Name: Tosh Glaze DOB: 1991-06-19 MRN: [id number]   Date of Service  03/06/2021  HPI/Events of Note  Notified of patient having nausea  eICU Interventions  Zofran 4 mg PO ordered prn     Intervention Category Minor Interventions: Routine modifications to care plan (e.g. PRN medications for pain, fever)  Rosalie Gums Ovadia Lopp 03/06/2021, 10:51 PM

## 2021-03-06 NOTE — Progress Notes (Signed)
eLink Physician-Brief Progress Note Patient Name: Jordan Adams DOB: 10/30/1990 MRN: [id number]   Date of Service  03/06/2021  HPI/Events of Note  Experiencing dysuria History of nephrolithiasis Initial UA 0-5 WBC, 11-20 RBC  eICU Interventions  Repeat UA     Intervention Category Intermediate Interventions: Other:  Darl Pikes 03/06/2021, 11:19 PM

## 2021-03-06 NOTE — Progress Notes (Signed)
Nutrition Follow-up  DOCUMENTATION CODES:   Obesity unspecified  INTERVENTION:  -Ensure Enlive po BID, each supplement provides 350 kcal and 20 grams of protein -MVI with minerals daily  NUTRITION DIAGNOSIS:   Inadequate oral intake related to inability to eat as evidenced by NPO status.  Progressing; diet advanced to regular  GOAL:   Patient will meet greater than or equal to 90% of their needs  progressing  MONITOR:   PO intake, Supplement acceptance, Weight trends, Labs, I & O's  REASON FOR ASSESSMENT:   Ventilator    ASSESSMENT:   30 year old male who presented to the ED on 7/29 after being found unresponsive in a grocery store parking lot. Pt required intubation. PMH of heroin abuse, seizure disorder. Pt admitted with acute encephalopathy, ARDS secondary to aspiration PNA, AKI.  7/29 intubated 7/31 extubated  Per RN, pt is threatening/requesting to leave AMA; underwent psych eval and deemed to be psychiatrically stable and appropriate for discharge at this time per psychiatry. Pt noted to be refusing Q4H CBGs and vital signs.   No PO intake documented. Will order oral nutrition supplements.   Medications: augmentin, heparin, protonix Labs: PO4 1.6 (L), elevated LFTs  UOP: 2.9L x24 hours I/O: +75.35ml since admit  Diet Order:   Diet Order             Diet regular Room service appropriate? Yes; Fluid consistency: Thin  Diet effective now                   EDUCATION NEEDS:   No education needs have been identified at this time  Skin:  Skin Assessment: Reviewed RN Assessment  Last BM:  7/31  Height:   Ht Readings from Last 1 Encounters:  03/04/21 6\' 1"  (1.854 m)    Weight:   Wt Readings from Last 1 Encounters:  03/06/21 91.6 kg    Ideal Body Weight:  83.6 kg  BMI:  Body mass index is 26.64 kg/m.  Estimated Nutritional Needs:   Kcal:  2300-2500  Protein:  115-130 grams  Fluid:  >2L    03/08/21, MS, RD, LDN  (she/her/hers) RD pager number and weekend/on-call pager number located in Amion.

## 2021-03-06 NOTE — Procedures (Signed)
Extubation Procedure Note  Patient Details:   Name: Jordan Adams DOB: [birth year] MRN: [id number]   Airway Documentation:    Vent end date: 03/06/21 Vent end time: 0905   Evaluation  O2 sats: stable throughout Complications: No apparent complications Patient did tolerate procedure well. Bilateral Breath Sounds: Clear, Diminished   Yes  Pt extubated to 2L [place]. Cuff leak present, no stridor noted, RN at bedside, MD aware, RT will continue to monitor. Orie Fisherman Jameil Whitmoyer 03/06/2021, 9:07 AM

## 2021-03-06 NOTE — Consult Note (Signed)
[facility] Face-to-Face Psychiatry Consult   Reason for Consult:  Drug Overdose, Recent suicidal ideation Referring Physician:  Dr.Dewald Patient Identification: Jordan Adams MRN:  [id number] Principal Diagnosis: <principal problem not specified> Diagnosis:  Active Problems:   Opioid overdose (HCC)   Total Time spent with patient: 45 minutes  Subjective:   Jordan Adams is a 30 y.o. male patient admitted with unintentional opiate overdose.  Patient significant history of opiate dependence, and reportedly continues to use heroin despite of being in a drug treatment court ordered program.  Despite his significant addiction, he denies any mental illness, previous diagnosis, suicide attempt,s.  He denies abusing any other illicit substances, alcohol, and or tobacco.  His mood is currently stable, and his wife is present at the bedside.  She denies any safety concerns, acute psychiatric concerns, and or additional questions at this time.  At present patient denies suicidal ideations, homicidal ideations, and or auditory visual hallucinations.  On evaluation patient is alert and oriented x4, calm and cooperative, slightly restless yet engages well.  He is linear, direct, with organized thought processes.  He does not appear to be psychotic, and or manic on exam.  He further denies any history of psychiatric conditions, suicide attempts, and or self-harm injuries.  He reports his only history is addiction, in which she is currently enrolled in a drug treatment court ordered program.  He currently states he is going to be in violation, if he does not show up for his program tomorrow.  His wife agrees.  Patient appears to be at very low risk for suicide completion, as well as acute violence.  He has threatened to leave the hospital AMA, patient is encouraged to stay in the hospital to complete medical treatment as he was recently just extubated.  His priority is attending his program tomorrow, or he will go to jail.   At this time patient appears to be psychiatrically stable, answers all questions appropriately and is appropriate for discharge at this time.  HPI:  30 year old male with past medical history as below, which is significant for opioid dependence and seizures on Keppra.  The patient presented to [facility] emergency department on 7/29 via EMS after being found unresponsive.  He was laying down in a grocery store parking lot.  Someone saw him at 5 AM laying there, but he was able to awaken and interact so they did not call EMS.  At around 7 AM he was found to be unresponsive at which point EMS was requested.  Upon EMS arrival the patient was indeed found to be unresponsive and was hypoxemic to 73% prompting transfer on nonrebreather.  He was given a dose of Narcan, to which he did have a significant response and was screaming at staff.  Ultimately given the patient's excited state and hypoxemia he required sedation and intubation.  PCCM was asked to evaluate for admission.  Past Psychiatric History: He denies any diagnosis, opiate dependence.  Denies any suicide attempts.  He does have multiple inpatient admission for opiate dependence, and detox.  Most recent admission in December 2021 at [facility], in which she was started on Suboxone and discharged with outpatient drug addiction program.  Risk to Self: Denies Risk to Others: Denies Prior Inpatient Therapy: See above Prior Outpatient Therapy: No current psychotropic medications.  He is currently enrolled in a court ordered drug treatment program.  Past Medical History:  Past Medical History:  Diagnosis Date   Seizures (HCC)    Ureterolithiasis  History reviewed. No pertinent surgical history. Family History: History reviewed. No pertinent family history. Family Psychiatric  History: Denies Social History:  Social History   Substance and Sexual Activity  Alcohol Use Never     Social History   Substance and Sexual Activity  Drug  Use Not Currently   Comment: past heroine user    Social History   Socioeconomic History   Marital status: Married    Spouse name: Not on file   Number of children: 3   Years of education: 12   Highest education level: Not on file  Occupational History   Occupation: Lobbyist work  Tobacco Use   Smoking status: Never   Smokeless tobacco: Current  Vaping Use   Vaping Use: Never used  Substance and Sexual Activity   Alcohol use: Never   Drug use: Not Currently    Comment: past heroine user   Sexual activity: Not on file  Other Topics Concern   Not on file  Social History Narrative   Left handed   One story home   Drinks no caffeine   Social Determinants of Health   Financial Resource Strain: Not on file  Food Insecurity: Not on file  Transportation Needs: Not on file  Physical Activity: Not on file  Stress: Not on file  Social Connections: Not on file   Additional Social History:    Allergies:   Allergies  Allergen Reactions   Tramadol Anaphylaxis, Itching and Swelling    Other reaction(s): Respiratory Distress (ALLERGY/intolerance)   Ketorolac Rash and Swelling    Also "puffy"     Labs:  Results for orders placed or performed during the hospital encounter of 03/04/21 (from the past 48 hour(s))  MRSA Next Gen by PCR, Nasal     Status: None   Collection Time: 03/04/21  2:18 PM   Specimen: Nasal Mucosa; Nasal Swab  Result Value Ref Range   MRSA by PCR Next Gen NOT DETECTED NOT DETECTED    Comment: (NOTE) The GeneXpert MRSA Assay (FDA approved for NASAL specimens only), is one component of a comprehensive MRSA colonization surveillance program. It is not intended to diagnose MRSA infection nor to guide or monitor treatment for MRSA infections. Test performance is not FDA approved in patients less than 1 years old. Performed at [facility], [address] [place], [place] [address]   Culture, blood (routine x 2)     Status: None (Preliminary  result)   Collection Time: 03/04/21  2:44 PM   Specimen: BLOOD  Result Value Ref Range   Specimen Description BLOOD LEFT ANTECUBITAL    Special Requests      BOTTLES DRAWN AEROBIC AND ANAEROBIC Blood Culture adequate volume   Culture      NO GROWTH 1 DAY Performed at [facility], [address] [place], [place] [address]    Report Status PENDING   Lactic acid, plasma     Status: Abnormal   Collection Time: 03/04/21  2:50 PM  Result Value Ref Range   Lactic Acid, Venous 2.5 (HH) 0.5 - 1.9 mmol/L    Comment: CRITICAL RESULT CALLED TO, READ BACK BY AND VERIFIED WITH: PAULSON,S RN @ [phone] 03/04/21 LEONARD,A Performed at [facility], [address], [place], [place] [address]   HIV Antibody (routine testing w rflx)     Status: None   Collection Time: 03/04/21  2:50 PM  Result Value Ref Range   HIV Screen 4th Generation wRfx Non Reactive  Non Reactive    Comment: Performed at [facility], [address] [place], [place] [address]  Basic metabolic panel     Status: Abnormal   Collection Time: 03/04/21  2:50 PM  Result Value Ref Range   Sodium 134 (L) 135 - 145 mmol/L   Potassium 4.4 3.5 - 5.1 mmol/L   Chloride 104 98 - 111 mmol/L   CO2 21 (L) 22 - 32 mmol/L   Glucose, Bld 106 (H) 70 - 99 mg/dL    Comment: Glucose reference range applies only to samples taken after fasting for at least 8 hours.   BUN 19 6 - 20 mg/dL   Creatinine, Ser 6.04 (H) 0.61 - 1.24 mg/dL   Calcium 7.6 (L) 8.9 - 10.3 mg/dL   GFR, Estimated >54 >09 mL/min    Comment: (NOTE) Calculated using the CKD-EPI Creatinine Equation (2021)    Anion gap 9 5 - 15    Comment: Performed at [facility], [address] [place], [place] [address]  Culture, blood (routine x 2)     Status: None (Preliminary result)   Collection Time: 03/04/21  2:51 PM   Specimen: BLOOD LEFT HAND  Result Value Ref Range   Specimen Description BLOOD LEFT HAND    Special Requests      AEROBIC BOTTLE ONLY Blood  Culture results may not be optimal due to an inadequate volume of blood received in culture bottles   Culture      NO GROWTH 1 DAY Performed at [facility], [address], [place], [place] [address]    Report Status PENDING   Glucose, capillary     Status: Abnormal   Collection Time: 03/04/21  3:14 PM  Result Value Ref Range   Glucose-Capillary 100 (H) 70 - 99 mg/dL    Comment: Glucose reference range applies only to samples taken after fasting for at least 8 hours.  Lactic acid, plasma     Status: Abnormal   Collection Time: 03/04/21  6:22 PM  Result Value Ref Range   Lactic Acid, Venous 2.1 (HH) 0.5 - 1.9 mmol/L    Comment: CRITICAL VALUE NOTED.  VALUE IS CONSISTENT WITH PREVIOUSLY REPORTED AND CALLED VALUE. Performed at [facility], [address], [place], [place] [address]   Glucose, capillary     Status: Abnormal   Collection Time: 03/04/21  7:59 PM  Result Value Ref Range   Glucose-Capillary 125 (H) 70 - 99 mg/dL    Comment: Glucose reference range applies only to samples taken after fasting for at least 8 hours.  Glucose, capillary     Status: Abnormal   Collection Time: 03/04/21 11:12 PM  Result Value Ref Range   Glucose-Capillary 110 (H) 70 - 99 mg/dL    Comment: Glucose reference range applies only to samples taken after fasting for at least 8 hours.  Glucose, capillary     Status: Abnormal   Collection Time: 03/05/21  3:45 AM  Result Value Ref Range   Glucose-Capillary 110 (H) 70 - 99 mg/dL    Comment: Glucose reference range applies only to samples taken after fasting for at least 8 hours.  I-STAT 7, (LYTES, BLD GAS, ICA, H+H)     Status: Abnormal   Collection Time: 03/05/21  4:28 AM  Result Value Ref Range   pH, Arterial 7.603 (HH) 7.350 - 7.450   pCO2 arterial 22.9 (L) 32.0 - 48.0 mmHg   pO2, Arterial 86 83.0 - 108.0 mmHg  Bicarbonate 22.8 20.0 - 28.0 mmol/L   TCO2 23 22 - 32 mmol/L   O2 Saturation 98.0 %   Acid-Base Excess 2.0 0.0 - 2.0 mmol/L    Sodium 138 135 - 145 mmol/L   Potassium 3.1 (L) 3.5 - 5.1 mmol/L   Calcium, Ion 1.10 (L) 1.15 - 1.40 mmol/L   HCT 37.0 (L) 39.0 - 52.0 %   Hemoglobin 12.6 (L) 13.0 - 17.0 g/dL   Patient temperature 16.1 F    Collection site Radial    Drawn by Operator    Sample type ARTERIAL   Triglycerides     Status: None   Collection Time: 03/05/21  4:55 AM  Result Value Ref Range   Triglycerides 68 <150 mg/dL    Comment: Performed at [facility], [address] [place], [place] [address]  CBC     Status: None   Collection Time: 03/05/21  4:55 AM  Result Value Ref Range   WBC 10.5 4.0 - 10.5 K/uL   RBC 4.42 4.22 - 5.81 MIL/uL   Hemoglobin 13.6 13.0 - 17.0 g/dL   HCT 54.0 98.1 - 19.1 %   MCV 88.9 80.0 - 100.0 fL   MCH 30.8 26.0 - 34.0 pg   MCHC 34.6 30.0 - 36.0 g/dL   RDW 47.8 29.5 - 62.1 %   Platelets 183 150 - 400 K/uL   nRBC 0.0 0.0 - 0.2 %    Comment: Performed at [facility], [address] [place], [place] [address]  Basic metabolic panel     Status: Abnormal   Collection Time: 03/05/21  4:55 AM  Result Value Ref Range   Sodium 137 135 - 145 mmol/L   Potassium 3.0 (L) 3.5 - 5.1 mmol/L   Chloride 107 98 - 111 mmol/L   CO2 23 22 - 32 mmol/L   Glucose, Bld 101 (H) 70 - 99 mg/dL    Comment: Glucose reference range applies only to samples taken after fasting for at least 8 hours.   BUN 14 6 - 20 mg/dL   Creatinine, Ser 7.84 0.61 - 1.24 mg/dL   Calcium 8.0 (L) 8.9 - 10.3 mg/dL   GFR, Estimated >69 >62 mL/min    Comment: (NOTE) Calculated using the CKD-EPI Creatinine Equation (2021)    Anion gap 7 5 - 15    Comment: Performed at [facility], [address] [place], [place] [address]  Magnesium     Status: None   Collection Time: 03/05/21  4:55 AM  Result Value Ref Range   Magnesium 1.9 1.7 - 2.4 mg/dL    Comment: Performed at [facility], [address], [place], [place] [address]  Phosphorus     Status: Abnormal   Collection Time: 03/05/21  4:55  AM  Result Value Ref Range   Phosphorus 1.8 (L) 2.5 - 4.6 mg/dL    Comment: Performed at [facility], [address] [place], [place] [address]  I-STAT 7, (LYTES, BLD GAS, ICA, H+H)     Status: Abnormal   Collection Time: 03/05/21  6:49 AM  Result Value Ref Range   pH, Arterial 7.396 7.350 - 7.450   pCO2 arterial 41.6 32.0 - 48.0 mmHg   pO2, Arterial 125 (H) 83.0 - 108.0 mmHg   Bicarbonate 25.6 20.0 - 28.0 mmol/L   TCO2 27 22 - 32 mmol/L   O2 Saturation 99.0 %   Acid-Base Excess 0.0 0.0 - 2.0 mmol/L   Sodium  141 135 - 145 mmol/L   Potassium 3.7 3.5 - 5.1 mmol/L   Calcium, Ion 1.15 1.15 - 1.40 mmol/L   HCT 38.0 (L) 39.0 - 52.0 %   Hemoglobin 12.9 (L) 13.0 - 17.0 g/dL   Patient temperature 60.1 F    Collection site Radial    Drawn by Operator    Sample type ARTERIAL   Glucose, capillary     Status: None   Collection Time: 03/05/21  7:07 AM  Result Value Ref Range   Glucose-Capillary 88 70 - 99 mg/dL    Comment: Glucose reference range applies only to samples taken after fasting for at least 8 hours.  Magnesium     Status: Abnormal   Collection Time: 03/05/21  9:55 AM  Result Value Ref Range   Magnesium 2.7 (H) 1.7 - 2.4 mg/dL    Comment: Performed at [facility], [address] [place], [place] [address]  Phosphorus     Status: None   Collection Time: 03/05/21  9:55 AM  Result Value Ref Range   Phosphorus 2.8 2.5 - 4.6 mg/dL    Comment: Performed at [facility], [address] [place], [place] [address]  Glucose, capillary     Status: None   Collection Time: 03/05/21 10:56 AM  Result Value Ref Range   Glucose-Capillary 86 70 - 99 mg/dL    Comment: Glucose reference range applies only to samples taken after fasting for at least 8 hours.  Glucose, capillary     Status: None   Collection Time: 03/05/21  3:33 PM  Result Value Ref Range   Glucose-Capillary 93 70 - 99 mg/dL    Comment: Glucose reference range applies only to samples taken after fasting  for at least 8 hours.  Magnesium     Status: None   Collection Time: 03/05/21  4:52 PM  Result Value Ref Range   Magnesium 2.2 1.7 - 2.4 mg/dL    Comment: Performed at [facility], [address] [place], [place] [address]  Phosphorus     Status: Abnormal   Collection Time: 03/05/21  4:52 PM  Result Value Ref Range   Phosphorus 2.4 (L) 2.5 - 4.6 mg/dL    Comment: Performed at [facility], [address] [place], [place] [address]  Glucose, capillary     Status: None   Collection Time: 03/05/21  7:12 PM  Result Value Ref Range   Glucose-Capillary 95 70 - 99 mg/dL    Comment: Glucose reference range applies only to samples taken after fasting for at least 8 hours.  Glucose, capillary     Status: None   Collection Time: 03/05/21 11:07 PM  Result Value Ref Range   Glucose-Capillary 95 70 - 99 mg/dL    Comment: Glucose reference range applies only to samples taken after fasting for at least 8 hours.  Glucose, capillary     Status: Abnormal   Collection Time: 03/06/21  3:11 AM  Result Value Ref Range   Glucose-Capillary 103 (H) 70 - 99 mg/dL    Comment: Glucose reference range applies only to samples taken after fasting for at least 8 hours.  Magnesium     Status: None   Collection Time: 03/06/21  4:15 AM  Result Value Ref Range   Magnesium 2.0 1.7 - 2.4 mg/dL    Comment: Performed at [facility], [address], [place], [place] [address]  Phosphorus     Status: Abnormal  Collection Time: 03/06/21  4:15 AM  Result Value Ref Range   Phosphorus 1.6 (L) 2.5 - 4.6 mg/dL    Comment: Performed at [facility], [address] [place], [place] [address]  Comprehensive metabolic panel     Status: Abnormal   Collection Time: 03/06/21  4:15 AM  Result Value Ref Range   Sodium 140 135 - 145 mmol/L   Potassium 3.6 3.5 - 5.1 mmol/L   Chloride 107 98 - 111 mmol/L   CO2 26 22 - 32 mmol/L   Glucose, Bld 99 70 - 99 mg/dL    Comment: Glucose reference range applies  only to samples taken after fasting for at least 8 hours.   BUN 14 6 - 20 mg/dL   Creatinine, Ser 0.98 0.61 - 1.24 mg/dL   Calcium 7.9 (L) 8.9 - 10.3 mg/dL   Total Protein 5.7 (L) 6.5 - 8.1 g/dL   Albumin 2.8 (L) 3.5 - 5.0 g/dL   AST 75 (H) 15 - 41 U/L   ALT 194 (H) 0 - 44 U/L   Alkaline Phosphatase 55 38 - 126 U/L   Total Bilirubin 0.7 0.3 - 1.2 mg/dL   GFR, Estimated >11 >91 mL/min    Comment: (NOTE) Calculated using the CKD-EPI Creatinine Equation (2021)    Anion gap 7 5 - 15    Comment: Performed at [facility], [address], [place], [place] [address]  Glucose, capillary     Status: None   Collection Time: 03/06/21  7:48 AM  Result Value Ref Range   Glucose-Capillary 97 70 - 99 mg/dL    Comment: Glucose reference range applies only to samples taken after fasting for at least 8 hours.  Glucose, capillary     Status: None   Collection Time: 03/06/21 11:02 AM  Result Value Ref Range   Glucose-Capillary 97 70 - 99 mg/dL    Comment: Glucose reference range applies only to samples taken after fasting for at least 8 hours.    Current Facility-Administered Medications  Medication Dose Route Frequency Provider Last Rate Last Admin   Ampicillin-Sulbactam (UNASYN) 3 g in sodium chloride 0.9 % 100 mL IVPB  3 g Intravenous Q6H Melody Comas B, MD 200 mL/hr at 03/06/21 1100 Infusion Verify at 03/06/21 1100   Chlorhexidine Gluconate Cloth 2 % PADS 6 each  6 each Topical Daily Charlott Holler, MD   6 each at 03/05/21 1315   ciprofloxacin (CILOXAN) 0.3 % ophthalmic solution 2 drop  2 drop Both Eyes Q2H while awake Duayne Cal, NP   2 drop at 03/06/21 1117   Followed by   ciprofloxacin (CILOXAN) 0.3 % ophthalmic solution 2 drop  2 drop Both Eyes Q4H while awake Duayne Cal, NP       feeding supplement (PROSource TF) liquid 45 mL  45 mL Per Tube BID Martina Sinner, MD       feeding supplement (VITAL HIGH PROTEIN) liquid 1,000 mL  1,000 mL Per Tube Q24H Martina Sinner, MD       heparin injection 5,000 Units  5,000 Units Subcutaneous Q8H Duayne Cal, NP   5,000 Units at 03/06/21 0504   hydrocortisone cream 1 % 1 application  1 application Topical BID Jeannette Corpus T, MD   1 application at 03/06/21 1043   levETIRAcetam (KEPPRA) IVPB 500 mg/100 mL premix  500 mg Intravenous BID Duayne Cal, NP 400 mL/hr at 03/06/21 1113 500 mg at 03/06/21 1113  pantoprazole (PROTONIX) injection 40 mg  40 mg Intravenous QHS Duayne CalHoffman, Paul W, NP   40 mg at 03/05/21 2057   potassium PHOSPHATE 45 mmol in dextrose 5 % 500 mL infusion  45 mmol Intravenous Once Oretha MilchKamat, Sunil G, MD 86 mL/hr at 03/06/21 1100 Infusion Verify at 03/06/21 1100   propofol (DIPRIVAN) 1000 MG/100ML infusion  0-50 mcg/kg/min Intravenous Continuous Duayne CalHoffman, Paul W, NP   Stopped at 03/06/21 [phone]    Musculoskeletal: Strength & Muscle Tone: within normal limits Gait & Station: normal Patient leans: N/A            Psychiatric Specialty Exam:  Presentation  General Appearance: Appropriate for Environment; Casual  Eye Contact:Fair  Speech:Clear and Coherent; Normal Rate  Speech Volume:Normal  Handedness:Right   Mood and Affect  Mood:Euthymic  Affect:Appropriate; Congruent   Thought Process  Thought Processes:Coherent; Goal Directed  Descriptions of Associations:Intact  Orientation:Full (Time, Place and Person)  Thought Content:Logical  History of Schizophrenia/Schizoaffective disorder:No data recorded Duration of Psychotic Symptoms:No data recorded Hallucinations:Hallucinations: None  Ideas of Reference:None  Suicidal Thoughts:Suicidal Thoughts: No  Homicidal Thoughts:Homicidal Thoughts: No   Sensorium  Memory:Immediate Good; Recent Good; Remote Good  Judgment:Fair  Insight:Fair   Executive Functions  Concentration:Fair  Attention Span:Fair  Recall:Fair  Fund of Knowledge:Fair  Language:Fair   Psychomotor Activity  Psychomotor  Activity:Psychomotor Activity: Normal   Assets  Assets:Desire for Improvement; Manufacturing systems engineerCommunication Skills; Financial Resources/Insurance; Housing; Leisure Time; Resilience; Physical Health   Sleep  Sleep:Sleep: Fair   Physical Exam: Physical Exam ROS Blood pressure 140/76, pulse 100, temperature 98.9 F (37.2 C), temperature source Oral, resp. rate 19, height 6\' 1"  (1.854 m), weight 91.6 kg, SpO2 93 %. Body mass index is 26.64 kg/m.  Treatment Plan Summary: Plan patient is psychiatrically clear.  He does not meet inpatient criteria at this time.  Patient does not appear to be a danger to himself and or others, therefore does not meet criteria for involuntary commitment.   Please anticipate possible withdrawal symptoms, agitation, worsening anxiety, and mood lability as patient's medications will not be resumed during his hospital admission.  Substance abuse is patient's primary issue, which would be best managed in alternative settings and do not necessitate an inpatient psych admission. Patient would benefit from accepting responsibility for their substance abuse and seeking substance abuse treatment.   -Abstain from substance abuse at this time.   -Would recommend to consult SW for rehab options if patient is amenable.  -Referral for outpatient substance abuse counseling and medication  Management, possibly [facility], or [facility] integrated behavioral health therapist.     Patient is determined to be psychiatrically stable at this time. Psychiatry will sign off. Please do not hesitate to call back if questions arise. Thank you for this consult.       Disposition: No evidence of imminent risk to self or others at present.   Patient does not meet criteria for psychiatric inpatient admission. Supportive therapy provided about ongoing stressors. Refer to IOP. Discussed crisis plan, support from social network, calling 911, coming to the Emergency Department, and calling Suicide  Hotline.  Maryagnes Amosakia S Starkes-Perry, FNP 03/06/2021 1:53 PM

## 2021-03-06 NOTE — Progress Notes (Signed)
No one has contacted the unit about patients ankle monitor at this time.

## 2021-03-06 NOTE — Progress Notes (Signed)
NAME:  Jordan Adams, MRN:  [id number], DOB:  Aug 19, 1990, LOS: 2 ADMISSION DATE:  03/04/2021, CONSULTATION DATE: 7/29 REFERRING MD: Dr. Audley Hose EDP, CHIEF COMPLAINT: Unresponsive  History of Present Illness:  30 year old male with past medical history as below, which is significant for opioid dependence and seizures on Keppra.  The patient presented to [facility] emergency department on 7/29 via EMS after being found unresponsive.  He was laying down in a grocery store parking lot.  Someone saw him at 5 AM laying there, but he was able to awaken and interact so they did not call EMS.  At around 7 AM he was found to be unresponsive at which point EMS was requested.  Upon EMS arrival the patient was indeed found to be unresponsive and was hypoxemic to 73% prompting transfer on nonrebreather.  He was given a dose of Narcan, to which he did have a significant response and was screaming at staff.  Ultimately given the patient's excited state and hypoxemia he required sedation and intubation.  PCCM was asked to evaluate for admission.  Pertinent  Medical History  Opioid dependence Seizures  Significant Hospital Events: Including procedures, antibiotic start and stop dates in addition to other pertinent events   7/29 admit, intubated 7/30 remained intubated due to mental status  Interim History / Subjective:   No acute events overnight.   Patient following commands this AM and has been extubated.  Objective   Blood pressure 115/63, pulse 87, temperature 99.8 F (37.7 C), temperature source Axillary, resp. rate 18, height 6\' 1"  (1.854 m), weight 91.6 kg, SpO2 98 %.    Vent Mode: PRVC FiO2 (%):  [40 %] 40 % Set Rate:  [18 bmp] 18 bmp Vt Set:  [550 mL] 550 mL PEEP:  [5 cmH20] 5 cmH20 Plateau Pressure:  [12 cmH20-16 cmH20] 16 cmH20   Intake/Output Summary (Last 24 hours) at 03/06/2021 0912 Last data filed at 03/06/2021 0600 Gross per 24 hour  Intake 2807.72 ml  Output 2600 ml  Net 207.72 ml    Filed Weights   03/04/21 0737 03/05/21 0224 03/06/21 0329  Weight: 120 kg 91.7 kg 91.6 kg    Examination: General: young male, no acute distress, intubated HENT: Normocephalic, atraumatic, sclera anicteric Lungs: diminished breath sounds bilaterally Cardiovascular: Regular rate and rhythm, no murmurs rubs gallops Abdomen: Soft, nontender, hypoactive Extremities: No acute deformity.  No edema.  No evidence of injection sites. Neuro: somnolent but awakes to verbal stimuli and follows commands GU: no foley  Resolved Hospital Problem list     Assessment & Plan:   Acute encephalopathy:  In setting of narcotic overdose and respiratory failure - Improved, sedation stopped to allow for extubation now that patient is following commands -EEG is negative for seizure activity -Resume home dose Keppra - UDS positive for opiates  ARDS: secondary to aspirtion pneumonia. PaO2/FiO2 after intubation 59  Aspiration pneumonia: Dense bilateral dependent infiltrates on CT 7/29 - Improved and patient extubated today - Unasyn for aspiration  Pneumomediastinum: Etiology unclear, barotrauma from high PEEP vs intubation issue. CT queries esophageal perf.  - No significant worsening on cxr - Will continue to monitor  AKI Hyperkalemia Lactic acidosis Hypomagnesemia Hypophosphatemia - bolus as needed - replete electrolytes PRN  Conjunctivitis - ciprofloxacin ophthalmic q2 hours while awake x 2 days than q 4 hours while awake for 5 additional days.   Elevated transaminases:  In setting of respiratory failure and sepsis - trending down    Best Practice (right click and "  Reselect all SmartList Selections" daily)   Diet/type: Regular consistency (see orders) DVT prophylaxis: prophylactic heparin  GI prophylaxis: PPI Lines: N/A Foley:  N/A Code Status:  full code Last date of multidisciplinary goals of care discussion: 7/30, spoke with patient's mother at the bedside, will continue full  spectrum of care.  Labs   CBC: Recent Labs  Lab 03/04/21 0935 03/04/21 1220 03/05/21 0428 03/05/21 0455 03/05/21 0649  WBC 12.6*  --   --  10.5  --   NEUTROABS 11.0*  --   --   --   --   HGB 15.7 15.6 12.6* 13.6 12.9*  HCT 49.1 46.0 37.0* 39.3 38.0*  MCV 95.3  --   --  88.9  --   PLT 270  --   --  183  --     Basic Metabolic Panel: Recent Labs  Lab 03/04/21 0755 03/04/21 0836 03/04/21 1450 03/05/21 0428 03/05/21 0455 03/05/21 0649 03/05/21 0955 03/05/21 1652 03/06/21 0415  NA 133*   < > 134* 138 137 141  --   --  140  K 5.3*   < > 4.4 3.1* 3.0* 3.7  --   --  3.6  CL 99  --  104  --  107  --   --   --  107  CO2 19*  --  21*  --  23  --   --   --  26  GLUCOSE 289*  --  106*  --  101*  --   --   --  99  BUN 18  --  19  --  14  --   --   --  14  CREATININE 2.05*  --  1.40*  --  0.95  --   --   --  0.84  CALCIUM 8.1*  --  7.6*  --  8.0*  --   --   --  7.9*  MG  --   --   --   --  1.9  --  2.7* 2.2 2.0  PHOS  --   --   --   --  1.8*  --  2.8 2.4* 1.6*   < > = values in this interval not displayed.   GFR: Estimated Creatinine Clearance: 148 mL/min (by C-G formula based on SCr of 0.84 mg/dL). Recent Labs  Lab 03/04/21 0755 03/04/21 0935 03/04/21 1450 03/04/21 1822 03/05/21 0455  WBC  --  12.6*  --   --  10.5  LATICACIDVEN 8.2*  --  2.5* 2.1*  --     Liver Function Tests: Recent Labs  Lab 03/04/21 0755 03/06/21 0415  AST 157* 75*  ALT 189* 194*  ALKPHOS 92 55  BILITOT 0.9 0.7  PROT 6.9 5.7*  ALBUMIN 3.7 2.8*   No results for input(s): LIPASE, AMYLASE in the last 168 hours. No results for input(s): AMMONIA in the last 168 hours.  ABG    Component Value Date/Time   PHART 7.396 03/05/2021 0649   PCO2ART 41.6 03/05/2021 0649   PO2ART 125 (H) 03/05/2021 0649   HCO3 25.6 03/05/2021 0649   TCO2 27 03/05/2021 0649   ACIDBASEDEF 4.0 (H) 03/04/2021 1220   O2SAT 99.0 03/05/2021 0649     Coagulation Profile: No results for input(s): INR, PROTIME in the  last 168 hours.  Cardiac Enzymes: Recent Labs  Lab 03/04/21 0755  CKTOTAL 268    HbA1C: No results found for: HGBA1C  CBG: Recent Labs  Lab 03/05/21 1533 03/05/21 1912  03/05/21 2307 03/06/21 0311 03/06/21 0748  GLUCAP 93 95 95 103* 97     Critical care time: 45 minutes    Melody Comas, MD [facility] Office: [phone]   See Amion for personal pager PCCM on call pager [phone] until 7pm. Please call Elink 7p-7a. [phone]

## 2021-03-06 NOTE — Progress Notes (Addendum)
Patient refused Q4H CBG's. Patient is currently resting in bed watching t.v. Patient also refusing vital signs to be taken.
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
## 2021-03-07 ENCOUNTER — Other Ambulatory Visit: Payer: Self-pay

## 2021-03-07 DIAGNOSIS — R7401 Elevation of levels of liver transaminase levels: Secondary | ICD-10-CM

## 2021-03-07 DIAGNOSIS — J690 Pneumonitis due to inhalation of food and vomit: Secondary | ICD-10-CM

## 2021-03-07 MED ORDER — AMOXICILLIN-POT CLAVULANATE 875-125 MG PO TABS: 1.0000 | ORAL_TABLET | Freq: Two times a day (BID) | ORAL | 0 refills | Status: AC

## 2021-03-07 MED ORDER — POTASSIUM & SODIUM PHOSPHATES 280-160-250 MG PO PACK
2.0000 | PACK | Freq: Once | ORAL | Status: AC
  Administered 2021-03-07: 2 via ORAL
  Filled 2021-03-07: qty 2

## 2021-03-07 NOTE — Discharge Summary (Addendum)
Physician Discharge Summary  Patient ID: Jordan Adams MRN: [id number] DOB/AGE: 09/24/1990 30 y.o.  Admit date: 03/04/2021 Discharge date: 03/07/2021    Discharge Diagnoses:  Acute Toxic Metabolic Encephalopathy  Narcotic Overdose ARDS Aspiration PNA  Pneumomediastinum  AKI  Hyperkalemia  Lactic Acidosis  Hypomagnesemia  Hypophosphatemia  Conjunctivitis  Elevated Transaminases                                                               DISCHARGE SUMMARY   30 y/o M with hx of seizures and opioid dependence who presented to [facility] on 7/29 after being found unresponsive.  At baseline, he is on Keppra for seizures.    On 7/29 he was found laying down in a grocery store parking lot around 5 am.  Bystanders were able to wake him so EMS was not called.  Around 7am he was found unresponsive and EMS was called.  On arrival, EMS found the patient unresponsive and hypoxemic with sats in the 70's.  He was given Narcan with improvement in mental status to the point he was screaming at staff.  While in ER, he was agitated and hypoxemic and ultimately required intubation.  UDS was positive for opiates.  EEG was assessed and negative for seizure. Home Keppra dosing was continued.  Post intubation ABG Pf ratio was 59 with CXR showing dense bilateral infiltrates consistent with aspiration PNA. Additional findings were pneumomediastinum thought secondary to barotrauma from high PEEP. Imaging did not show any leak into pleural space / effusion.  He was treated with unasyn for aspiration PNA.  Additional findings were AKI, electrolyte disturbances and elevated transaminases.  On 7/31 he met criteria for extubation and was successfully liberated from mechanical ventilation. He tolerated oral diet and was ambulated on RA without desaturation. The patient was medically cleared for discharge 8/1 with plans as above. He was insistent on leaving the hospital due to meeting a court appointment - was going to leave AMA  if not discharged.       DISCHARGE PLAN BY DIAGNOSIS      Acute Toxic Metabolic Encephalopathy  Narcotic Overdose  Discharge Plan: -cessation counseling while inpatient  -pt encouraged to seek counseling, NA -cleared by psychiatry   ARDS Aspiration PNA  Pneumomediastinum   Discharge Plan: -will need follow up CXR with primary MD -if develops new chest discomfort or difficulty swallowing, pt instructed to report to ER  -ambulated prior to discharge, no O2 needs with exertion   AKI  Hyperkalemia  Lactic Acidosis  Hypomagnesemia  Hypophosphatemia   Discharge Plan: -resolved, no acute needs at follow up   Conjunctivitis   Discharge Plan: -completed Ciproloxacin ophthalmic while inpatient   Elevated Transaminases   Discharge Plan: -follow up LFT's with PCP as outpatient                               KEY EVENTS 7/29 Admit with overdose, ARDS, intubated 7/30 remained intubated 7/31 liberated from vent   SIGNIFICANT DIAGNOSTIC STUDIES 7/29 UDS >> positive for opiates  7/29 CT head / spine >> no acute abnormality  7/29 CT Chest / ABD / Pelvis >> negative for acute PE or dissection, mild pneumomediastinum, extensive consolidation throughout posterior aspect of both lungs,  NGT in stomach   MICRO DATA  BC 7/29 >>  MRSA 7/29 >> negative  COVID + Influenza PCR 7/29 >> negative    CONSULTS Psychiatry    Discharge Exam: General: adult male lying in bed in NAD Neuro:AAOx4, speech clear, MAE, non-focal CV: S1S2 rrr PULM: non-labored on RA, lungs bilaterally clear GI:non-tender, non-distended Extremities: warm/dry   Vitals:   03/06/21 1400 03/07/21 0203 03/07/21 0718 03/07/21 0830  BP:    (!) 129/96  Pulse: 90     Resp: (!) 26     Temp:   98 F (36.7 C)   TempSrc:      SpO2: 94%     Weight:  91.6 kg    Height:         Discharge Labs  BMET Recent Labs  Lab 03/04/21 0755 03/04/21 0836 03/04/21 1450 03/05/21 0428 03/05/21 0455  03/05/21 0649 03/05/21 0955 03/05/21 1652 03/06/21 0415  NA 133*   < > 134* 138 137 141  --   --  140  K 5.3*   < > 4.4 3.1* 3.0* 3.7  --   --  3.6  CL 99  --  104  --  107  --   --   --  107  CO2 19*  --  21*  --  23  --   --   --  26  GLUCOSE 289*  --  106*  --  101*  --   --   --  99  BUN 18  --  19  --  14  --   --   --  14  CREATININE 2.05*  --  1.40*  --  0.95  --   --   --  0.84  CALCIUM 8.1*  --  7.6*  --  8.0*  --   --   --  7.9*  MG  --   --   --   --  1.9  --  2.7* 2.2 2.0  PHOS  --   --   --   --  1.8*  --  2.8 2.4* 1.6*   < > = values in this interval not displayed.    CBC Recent Labs  Lab 03/04/21 0935 03/04/21 1220 03/05/21 0428 03/05/21 0455 03/05/21 0649  HGB 15.7   < > 12.6* 13.6 12.9*  HCT 49.1   < > 37.0* 39.3 38.0*  WBC 12.6*  --   --  10.5  --   PLT 270  --   --  183  --    < > = values in this interval not displayed.    Anti-Coagulation No results for input(s): INR in the last 168 hours.  Discharge Instructions     Call MD for:   Complete by: As directed    Chest pain or difficulty swallowing   Call MD for:  difficulty breathing, headache or visual disturbances   Complete by: As directed    Call MD for:  persistant dizziness or light-headedness   Complete by: As directed    Call MD for:  persistant nausea and vomiting   Complete by: As directed    Call MD for:  severe uncontrolled pain   Complete by: As directed    Call MD for:  temperature >100.4   Complete by: As directed    Diet general   Complete by: As directed    Discharge instructions   Complete by: As directed    1. Call the [facility]  and [place] for a follow up appointment  2. Take your antibiotics as prescribed 3. Return to the ER if you have new or worsening symptoms - new chest pain or difficulty swallowing.   Increase activity slowly   Complete by: As directed            Allergies as of 03/07/2021       Reactions   Tramadol Anaphylaxis, Itching,  Swelling   Other reaction(s): Respiratory Distress (ALLERGY/intolerance)   Ketorolac Rash, Swelling   Also "puffy"        Medication List     STOP taking these medications    ondansetron 8 MG disintegrating tablet Commonly known as: Zofran ODT   oxyCODONE-acetaminophen 10-325 MG tablet Commonly known as: Percocet       TAKE these medications    amoxicillin-clavulanate 875-125 MG tablet Commonly known as: AUGMENTIN Take 1 tablet by mouth every 12 (twelve) hours for 2 days.   levETIRAcetam 500 MG tablet Commonly known as: Keppra Take 1 tablet (500 mg total) by mouth 2 (two) times daily.          Disposition: Home. No new home health needs identified prior to discharge.   Discharged Condition: Jordan Adams has met maximum benefit of inpatient care and is medically stable and cleared for discharge.  Patient is pending follow up as above.      Time spent on disposition:  Greater than 35 Minutes.     Signed: Noe Gens, MSN, APRN, NP-C, AGACNP-BC [facility] 03/07/2021, 9:02 AM   Please see [email or web address] for pager details.

## 2021-03-07 NOTE — Progress Notes (Signed)
Walking O2 sat  Patient Saturations on Room Air at Rest = 100%  Patient Saturations on [employer] while Ambulating = 95%

## 2021-03-07 NOTE — Progress Notes (Signed)
Forde Radon to be D/C'd Home per MD order.  Discussed with the patient and all questions fully answered.  VSS, Skin clean, dry and intact without evidence of skin break down, no evidence of skin tears noted. IV catheter discontinued intact. Site without signs and symptoms of complications. Dressing and pressure applied.  An After Visit Summary was printed and given to the patient. Patient received prescription.  D/c education completed with patient/family including follow up instructions, medication list, d/c activities limitations if indicated, with other d/c instructions as indicated by MD - patient able to verbalize understanding, all questions fully answered.   Patient instructed to return to ED, call 911, or call MD for any changes in condition.   Patient escorted via WC, and D/C home via private auto.  Jordan Adams 03/07/2021 9:04 AM

## 2021-03-09 LAB — CULTURE, BLOOD (ROUTINE X 2)
Culture: NO GROWTH
Culture: NO GROWTH
Special Requests: ADEQUATE

## 2021-03-17 ENCOUNTER — Emergency Department (HOSPITAL_BASED_OUTPATIENT_CLINIC_OR_DEPARTMENT_OTHER)
Admission: EM | Admit: 2021-03-17 | Discharge: 2021-03-17 | Disposition: A | Discharge status: Home / Self Care | Payer: Self-pay | Attending: Emergency Medicine | Admitting: Emergency Medicine

## 2021-03-17 ENCOUNTER — Encounter (HOSPITAL_BASED_OUTPATIENT_CLINIC_OR_DEPARTMENT_OTHER): Payer: Self-pay | Admitting: *Deleted

## 2021-03-17 ENCOUNTER — Other Ambulatory Visit: Payer: Self-pay

## 2021-03-17 DIAGNOSIS — R21 Rash and other nonspecific skin eruption: Secondary | ICD-10-CM | POA: Insufficient documentation

## 2021-03-17 DIAGNOSIS — F17220 Nicotine dependence, chewing tobacco, uncomplicated: Secondary | ICD-10-CM | POA: Insufficient documentation

## 2021-03-17 HISTORY — DX: Poisoning by unspecified drugs, medicaments and biological substances, accidental (unintentional), initial encounter: T50.901A

## 2021-03-17 NOTE — Discharge Instructions (Addendum)
Jordan Adams has a rash but could be consistent with an insect or an sting.  This does not appear to be a contagious rash.  He does not require further medications or medical treatment for this rash at this time.  I expect this rash improve over the next week.    Alvester Chou, MD

## 2021-03-17 NOTE — ED Provider Notes (Signed)
[facility] EMERGENCY DEPARTMENT Provider Note   CSN: [id number] Arrival date & time: 03/17/21  1339     History Chief Complaint  Patient presents with   Rash    Jordan Adams is a 30 y.o. male presenting to the emergency department for medical clearance with a rash on his back and neck.  The patient reports that he overdosed approximately 1 and 1/2 weeks ago after using fentanyl in a parking lot.  He was brought into the ED in respiratory failure and intubated and spent some time in the intensive care unit, discharged on 03/07/21.  He reports at the time of his overdose he was told that he had passed out in a parking lot and there been ants forming over him while he was lying in the parking lot, and believes that he was stung multiple times on the back and around the neck.  He had a significant erythematous rash at that time which is gradually improved.  The rash is present on his back and wraps around towards his neck.  It is not present on his lower extremities or in his fingers.  The rash is nonpruritic, does not itch or burn.  He is here for medical clearance in an effort to get into [facility] rehab.  He reports he has been sober since his overdose.  HPI     Past Medical History:  Diagnosis Date   Overdose    Seizures (HCC)    Ureterolithiasis     Patient Active Problem List   Diagnosis Date Noted   Opioid overdose (HCC) 03/04/2021    History reviewed. No pertinent surgical history.     No family history on file.  Social History   Tobacco Use   Smoking status: Never   Smokeless tobacco: Current  Vaping Use   Vaping Use: Never used  Substance Use Topics   Alcohol use: Never   Drug use: Not Currently    Comment: past heroine user    Home Medications Prior to Admission medications   Not on File    Allergies    Tramadol and Ketorolac  Review of Systems   Review of Systems  Skin:  Positive for rash. Negative for wound.   Physical Exam Updated Vital  Signs BP 136/85   Pulse 87   Temp 98.4 F (36.9 C) (Oral)   Resp 16   Ht 6\' 1"  (1.854 m)   Wt 88.5 kg   SpO2 100%   BMI 25.73 kg/m   Physical Exam Constitutional:      General: He is not in acute distress. HENT:     Head: Normocephalic and atraumatic.  Eyes:     Conjunctiva/sclera: Conjunctivae normal.     Pupils: Pupils are equal, round, and reactive to light.  Cardiovascular:     Rate and Rhythm: Normal rate and regular rhythm.  Pulmonary:     Effort: Pulmonary effort is normal. No respiratory distress.  Skin:    General: Skin is warm and dry.     Comments: Erythematous papular rash in serpinegous pattern traveling up back and around anterior neck, present in axilla  Neurological:     General: No focal deficit present.     Mental Status: He is alert. Mental status is at baseline.    ED Results / Procedures / Treatments   Labs (all labs ordered are listed, but only abnormal results are displayed) Labs Reviewed - No data to display  EKG None  Radiology No results found.  Procedures  Procedures   Medications Ordered in ED Medications - No data to display  ED Course  I have reviewed the triage vital signs and the nursing notes.  Pertinent labs & imaging results that were available during my care of the patient were reviewed by me and considered in my medical decision making (see chart for details).  Patient is here with a rash in a pattern that would be consistent with hymenoptera venom or an staying.  He reports improvement of the rash since the past week, and there is no longer itchy or pruritic.  This significant lowers my suspicion for scabies or other infectious process.  As the rash is improving, I do not believe he needs additional treatment at this time.  Advised to expect the rash continue improving over the next several days.  If it worsens or spreads to other parts of his body, he would need another medical examination.  He verbalized understanding.  His  exam forms were filled out     Final Clinical Impression(s) / ED Diagnoses Final diagnoses:  Rash    Rx / DC Orders ED Discharge Orders     None        Terald Sleeper, MD 03/17/21 1451

## 2021-03-17 NOTE — ED Triage Notes (Signed)
C/o rash to back x 14 days , pt reports ant bites , pt is in [facility]

## 2021-07-05 ENCOUNTER — Encounter (HOSPITAL_COMMUNITY): Payer: Self-pay | Admitting: Emergency Medicine

## 2021-07-05 ENCOUNTER — Emergency Department (HOSPITAL_COMMUNITY)
Admission: EM | Admit: 2021-07-05 | Discharge: 2021-07-06 | Disposition: A | Discharge status: Home / Self Care | Payer: Self-pay | Attending: Emergency Medicine | Admitting: Emergency Medicine

## 2021-07-05 ENCOUNTER — Other Ambulatory Visit: Payer: Self-pay

## 2021-07-05 ENCOUNTER — Emergency Department (HOSPITAL_COMMUNITY): Payer: Self-pay

## 2021-07-05 DIAGNOSIS — F17220 Nicotine dependence, chewing tobacco, uncomplicated: Secondary | ICD-10-CM | POA: Insufficient documentation

## 2021-07-05 DIAGNOSIS — M25562 Pain in left knee: Secondary | ICD-10-CM | POA: Insufficient documentation

## 2021-07-05 LAB — BASIC METABOLIC PANEL
Anion gap: 5 (ref 5–15)
BUN: 18 mg/dL (ref 6–20)
CO2: 28 mmol/L (ref 22–32)
Calcium: 9.1 mg/dL (ref 8.9–10.3)
Chloride: 104 mmol/L (ref 98–111)
Creatinine, Ser: 0.82 mg/dL (ref 0.61–1.24)
GFR, Estimated: >60 mL/min (ref >60)
Glucose, Bld: 82 mg/dL (ref 70–99)
Potassium: 4.7 mmol/L (ref 3.5–5.1)
Sodium: 137 mmol/L (ref 135–145)

## 2021-07-05 MED ORDER — FENTANYL CITRATE PF 50 MCG/ML IJ SOSY
50.0000 ug | PREFILLED_SYRINGE | Freq: Once | INTRAMUSCULAR | Status: AC
  Administered 2021-07-05: 50 ug via INTRAVENOUS

## 2021-07-05 NOTE — ED Triage Notes (Signed)
C/o L knee pain since last night.  States he thinks he tore his meniscus.

## 2021-07-05 NOTE — ED Provider Notes (Signed)
Emergency Medicine Provider Triage Evaluation Note  Jordan Adams , a 30 y.o. male  was evaluated in triage.  Pt complains of left-sided knee pain since last night, patient states that he thinks he tore his meniscus.  States he had imaging done about 4 years ago that showed that he tore his meniscus, but last night patient had rapid increase in severity and noticed to "pop" sound when he stood up.  He has been trying over-the-counter medications and warm compresses without relief.  Review of Systems  Positive: Left knee pain Negative: Numbness, tingling  Physical Exam  BP (!) 143/91 (BP Location: Left Arm)   Pulse 87   Temp 98.7 F (37.1 C) (Oral)   Resp 14   SpO2 100%  Gen:   Awake, no distress   Resp:  Normal effort  MSK:   Moves extremities without difficulty  Other:    Medical Decision Making  Medically screening exam initiated at 4:29 PM.  Appropriate orders placed.  Tj Kitchings was informed that the remainder of the evaluation will be completed by another provider, this initial triage assessment does not replace that evaluation, and the importance of remaining in the ED until their evaluation is complete.  Of note patient states that he had a prior opioid addiction, and has paperwork with him that he would like a provider to fill out in the setting that any narcotics are prescribed outpatient so he can take to his drug treatment program   Jeanella Flattery 07/05/21 1645    Long, Arlyss Repress, MD 07/11/21 1029

## 2021-07-06 MED ORDER — FENTANYL CITRATE PF 50 MCG/ML IJ SOSY
25.0000 ug | PREFILLED_SYRINGE | Freq: Once | INTRAMUSCULAR | Status: DC
  Filled 2021-07-06: qty 1

## 2021-07-06 MED ORDER — KETOROLAC TROMETHAMINE 15 MG/ML IJ SOLN
15.0000 mg | Freq: Once | INTRAMUSCULAR | Status: AC
  Administered 2021-07-06: 15 mg via INTRAMUSCULAR
  Filled 2021-07-06: qty 1

## 2021-07-06 MED ORDER — NAPROXEN 375 MG PO TABS: 375.0000 mg | ORAL_TABLET | Freq: Two times a day (BID) | ORAL | 0 refills | Status: DC

## 2021-07-06 NOTE — ED Provider Notes (Signed)
[facility] EMERGENCY DEPARTMENT Provider Note   CSN: [id number] Arrival date & time: 07/05/21  1355     History Chief Complaint  Patient presents with   Knee Pain    Jordan Adams is a 30 y.o. male who presents to the emergency department with chronic ongoing left knee pain that has been acutely worse over the last 2 days.  Patient states that he had a ACL and meniscal tear back in high school playing football.  This was never repaired.  Couple years ago he also states that he was on vacation and states he tore it again and follow-up with orthopedics who recommended having surgery.  He did not have repair at that time either.  2 nights ago patient woke up from sleep got out of bed felt a loud popping sensation in the knee followed by severe pain.  His knee pain has been constant since onset.  It is worse with any movement.  He rates his knee pain 8/10 in severity.  He denies any fever, chills, rheumatological diseases, trauma, new injury to the knee.  Of note, patient is in a drug rehab program.   Knee Pain     Past Medical History:  Diagnosis Date   Overdose    Seizures (HCC)    Ureterolithiasis     Patient Active Problem List   Diagnosis Date Noted   Opioid overdose (HCC) 03/04/2021    History reviewed. No pertinent surgical history.     No family history on file.  Social History   Tobacco Use   Smoking status: Never   Smokeless tobacco: Current  Vaping Use   Vaping Use: Never used  Substance Use Topics   Alcohol use: Never   Drug use: Not Currently    Comment: past heroine user    Home Medications Prior to Admission medications   Medication Sig Start Date End Date Taking? Authorizing Provider  naproxen (NAPROSYN) 375 MG tablet Take 1 tablet (375 mg total) by mouth 2 (two) times daily. 07/06/21  Yes Honor Loh M, PA-C    Allergies    Tramadol and Ketorolac  Review of Systems   Review of Systems  All other systems reviewed and  are negative.  Physical Exam Updated Vital Signs BP (!) 142/85 (BP Location: Right Arm)   Pulse 70   Temp 97.8 F (36.6 C) (Oral)   Resp 16   Ht 6\' 1"  (1.854 m)   Wt 95.3 kg   SpO2 100%   BMI 27.71 kg/m   Physical Exam Vitals and nursing note reviewed.  Constitutional:      Appearance: Normal appearance.  HENT:     Head: Normocephalic and atraumatic.  Eyes:     General:        Right eye: No discharge.        Left eye: No discharge.     Conjunctiva/sclera: Conjunctivae normal.  Pulmonary:     Effort: Pulmonary effort is normal.  Musculoskeletal:     Comments: Mild swelling to the left knee. Minimally positive ballottement test.  Difficult to assess ACL and PCL secondary to painful range of motion.  Negative valgus and varus testing.  Patient is neurovascular intact at the foot with a strong 2+ dorsalis pedis pulse and posterior tibialis pulse.  Good cap refill in the toes.  Skin:    General: Skin is warm and dry.     Findings: No rash.  Neurological:     General: No focal deficit present.  Mental Status: He is alert.  Psychiatric:        Mood and Affect: Mood normal.        Behavior: Behavior normal.    ED Results / Procedures / Treatments   Labs (all labs ordered are listed, but only abnormal results are displayed) Labs Reviewed  BASIC METABOLIC PANEL    EKG None  Radiology MR KNEE LEFT WO CONTRAST  Result Date: 07/06/2021 CLINICAL DATA:  Knee pain, concern for meniscal tear EXAM: MRI OF THE LEFT KNEE WITHOUT CONTRAST TECHNIQUE: Multiplanar, multisequence MR imaging of the knee was performed. No intravenous contrast was administered. COMPARISON:  Radiographs dated August 23, 2020 FINDINGS: FINDINGS MENISCI Medial meniscus:  Intact. Lateral meniscus:  Intact. LIGAMENTS Cruciates:  Intact ACL and PCL. Collaterals: Medial collateral ligament is intact. Lateral collateral ligament complex is intact. CARTILAGE Patellofemoral:  No chondral defect. Medial: Minimal  fissuring at the weight-bearing area. No full-thickness cartilage defect. Lateral:  No chondral defect. Joint:  No joint effusion. Normal Hoffa's fat. No plical thickening. Popliteal Fossa:  No Baker cyst. Intact popliteus tendon. Extensor Mechanism:  Intact quadriceps tendon and patellar tendon. Bones: No focal marrow signal abnormality. No fracture or dislocation. Other: None. IMPRESSION: 1.  No meniscal tear seen. 2.  Cruciate ligaments are intact. 3.  Osseous structures are unremarkable. Electronically Signed   By: Larose Hires D.O.   On: 07/06/2021 08:26    Procedures Procedures   Medications Ordered in ED Medications  fentaNYL (SUBLIMAZE) injection 50 mcg (50 mcg Intravenous Given 07/05/21 1848)  ketorolac (TORADOL) 15 MG/ML injection 15 mg (15 mg Intramuscular Given 07/06/21 6606)    ED Course  I have reviewed the triage vital signs and the nursing notes.  Pertinent labs & imaging results that were available during my care of the patient were reviewed by me and considered in my medical decision making (see chart for details).  Clinical Course as of 07/06/21 1100  Wed Jul 06, 2021  3016 Patient is able to tolerate ibuprofen without any allergic reactions.  I will prescribe him naproxen. [CF]    Clinical Course User Index [CF] Jolyn Lent   MDM Rules/Calculators/A&P                          Jordan Adams is a 30 y.o. male who presents the emergency department with left knee pain.  I am concerned that he has either a ligamentous injury like ACL versus meniscal tear.  Given that the patient has a catching and clicking sensation in the knee I am slightly more suspicious for meniscal tear.  MRI was ordered out in triage.  Patient received fentanyl and tramadol for pain.  I will give him another round of tramadol.  MRI was negative for any cruciate ligament or meniscal tears.  I would low suspicion for septic arthritis at this time.  The knee is not erythematous, significantly  swollen, or warm to palpation.  Patient's vital signs are otherwise normal.  Given the clinical scenario, I will place him in a knee immobilizer for comfort give him some crutches and give him a short course of naproxen.  I will also have him follow-up with orthopedics for further evaluation.   Final Clinical Impression(s) / ED Diagnoses Final diagnoses:  Acute pain of left knee    Rx / DC Orders ED Discharge Orders          Ordered    naproxen (NAPROSYN) 375 MG tablet  2 times daily        07/06/21 0847             Teressa Lower, PA-C 07/06/21 1100    Gloris Manchester, MD 07/06/21 1113

## 2021-07-06 NOTE — Discharge Instructions (Addendum)
Your MRI results were negative for any meniscal or ligamentous injuries.  At this point, I am not exactly sure what is causing your knee pain.  Please use knee immobilizer and crutches for comfort.  I will also prescribe you a short course of naproxen which is a strong anti-inflammatory.  Please take this as scheduled.  I would like for you to follow-up with orthopedics for further evaluation.  Please turn to the emergency department if you experience worsening pain, fevers, significant swelling, significant warmth to the knee, or any other concerns you may have.

## 2021-07-06 NOTE — Progress Notes (Signed)
Orthopedic Tech Progress Note Patient Details:  Jordan Adams 04-18-1991 [id number]  Ortho Devices Type of Ortho Device: Crutches, Knee Immobilizer Ortho Device/Splint Location: RLE Ortho Device/Splint Interventions: Ordered, Adjustment, Application   Post Interventions Patient Tolerated: Ambulated well, Well Instructions Provided: Care of device  Jordan Adams 07/06/2021, 9:21 AM

## 2021-08-27 ENCOUNTER — Inpatient Hospital Stay (HOSPITAL_COMMUNITY)
Admission: EM | Admit: 2021-08-27 | Discharge: 2021-09-02 | DRG: 871 | Disposition: A | Discharge status: Home / Self Care | Payer: Self-pay | Attending: Internal Medicine | Admitting: Internal Medicine

## 2021-08-27 ENCOUNTER — Encounter (HOSPITAL_COMMUNITY): Payer: Self-pay

## 2021-08-27 ENCOUNTER — Inpatient Hospital Stay (HOSPITAL_COMMUNITY): Payer: Self-pay

## 2021-08-27 ENCOUNTER — Emergency Department (HOSPITAL_COMMUNITY): Payer: Self-pay

## 2021-08-27 ENCOUNTER — Other Ambulatory Visit: Payer: Self-pay

## 2021-08-27 DIAGNOSIS — F17220 Nicotine dependence, chewing tobacco, uncomplicated: Secondary | ICD-10-CM | POA: Diagnosis present

## 2021-08-27 DIAGNOSIS — W132XXA Fall from, out of or through roof, initial encounter: Secondary | ICD-10-CM | POA: Diagnosis present

## 2021-08-27 DIAGNOSIS — J189 Pneumonia, unspecified organism: Secondary | ICD-10-CM | POA: Diagnosis present

## 2021-08-27 DIAGNOSIS — S0081XA Abrasion of other part of head, initial encounter: Secondary | ICD-10-CM | POA: Diagnosis present

## 2021-08-27 DIAGNOSIS — Z6827 Body mass index (BMI) 27.0-27.9, adult: Secondary | ICD-10-CM

## 2021-08-27 DIAGNOSIS — R768 Other specified abnormal immunological findings in serum: Secondary | ICD-10-CM | POA: Diagnosis present

## 2021-08-27 DIAGNOSIS — R7881 Bacteremia: Secondary | ICD-10-CM | POA: Diagnosis present

## 2021-08-27 DIAGNOSIS — Z20822 Contact with and (suspected) exposure to covid-19: Secondary | ICD-10-CM | POA: Diagnosis present

## 2021-08-27 DIAGNOSIS — E871 Hypo-osmolality and hyponatremia: Secondary | ICD-10-CM | POA: Diagnosis present

## 2021-08-27 DIAGNOSIS — B9561 Methicillin susceptible Staphylococcus aureus infection as the cause of diseases classified elsewhere: Secondary | ICD-10-CM

## 2021-08-27 DIAGNOSIS — F1910 Other psychoactive substance abuse, uncomplicated: Secondary | ICD-10-CM | POA: Diagnosis present

## 2021-08-27 DIAGNOSIS — R6520 Severe sepsis without septic shock: Secondary | ICD-10-CM | POA: Diagnosis present

## 2021-08-27 DIAGNOSIS — F1110 Opioid abuse, uncomplicated: Secondary | ICD-10-CM | POA: Diagnosis present

## 2021-08-27 DIAGNOSIS — R7989 Other specified abnormal findings of blood chemistry: Secondary | ICD-10-CM | POA: Diagnosis present

## 2021-08-27 DIAGNOSIS — N179 Acute kidney failure, unspecified: Secondary | ICD-10-CM | POA: Diagnosis present

## 2021-08-27 DIAGNOSIS — J9601 Acute respiratory failure with hypoxia: Secondary | ICD-10-CM | POA: Diagnosis present

## 2021-08-27 DIAGNOSIS — A4101 Sepsis due to Methicillin susceptible Staphylococcus aureus: Principal | ICD-10-CM | POA: Diagnosis present

## 2021-08-27 DIAGNOSIS — F419 Anxiety disorder, unspecified: Secondary | ICD-10-CM | POA: Diagnosis present

## 2021-08-27 DIAGNOSIS — D696 Thrombocytopenia, unspecified: Secondary | ICD-10-CM | POA: Diagnosis present

## 2021-08-27 DIAGNOSIS — E663 Overweight: Secondary | ICD-10-CM | POA: Diagnosis present

## 2021-08-27 DIAGNOSIS — E861 Hypovolemia: Secondary | ICD-10-CM | POA: Diagnosis present

## 2021-08-27 DIAGNOSIS — B1920 Unspecified viral hepatitis C without hepatic coma: Secondary | ICD-10-CM | POA: Diagnosis present

## 2021-08-27 LAB — COMPREHENSIVE METABOLIC PANEL
ALT: 139 U/L — ABNORMAL HIGH (ref 0–44)
AST: 140 U/L — ABNORMAL HIGH (ref 15–41)
Albumin: 4.5 g/dL (ref 3.5–5.0)
Alkaline Phosphatase: 66 U/L (ref 38–126)
Anion gap: 18 — ABNORMAL HIGH (ref 5–15)
BUN: 28 mg/dL — ABNORMAL HIGH (ref 6–20)
CO2: 15 mmol/L — ABNORMAL LOW (ref 22–32)
Calcium: 8.6 mg/dL — ABNORMAL LOW (ref 8.9–10.3)
Chloride: 97 mmol/L — ABNORMAL LOW (ref 98–111)
Creatinine, Ser: 1.74 mg/dL — ABNORMAL HIGH (ref 0.61–1.24)
GFR, Estimated: 54 mL/min — ABNORMAL LOW (ref >60)
Glucose, Bld: 97 mg/dL (ref 70–99)
Potassium: 4.2 mmol/L (ref 3.5–5.1)
Sodium: 130 mmol/L — ABNORMAL LOW (ref 135–145)
Total Bilirubin: 2 mg/dL — ABNORMAL HIGH (ref 0.3–1.2)
Total Protein: 8.3 g/dL — ABNORMAL HIGH (ref 6.5–8.1)

## 2021-08-27 LAB — RESP PANEL BY RT-PCR (FLU A&B, COVID) ARPGX2
Influenza A by PCR: NEGATIVE
Influenza B by PCR: NEGATIVE
SARS Coronavirus 2 by RT PCR: NEGATIVE

## 2021-08-27 LAB — PROTIME-INR
INR: 1.1 (ref 0.8–1.2)
Prothrombin Time: 14.4 seconds (ref 11.4–15.2)

## 2021-08-27 LAB — APTT: aPTT: 28 seconds (ref 24–36)

## 2021-08-27 LAB — LACTIC ACID, PLASMA
Lactic Acid, Venous: 0.8 mmol/L (ref 0.5–1.9)
Lactic Acid, Venous: 1.1 mmol/L (ref 0.5–1.9)

## 2021-08-27 MED ORDER — IOHEXOL 300 MG/ML  SOLN
75.0000 mL | Freq: Once | INTRAMUSCULAR | Status: AC | PRN
  Administered 2021-08-27: 75 mL via INTRAVENOUS

## 2021-08-27 MED ORDER — ACETAMINOPHEN 650 MG RE SUPP: 650.0000 mg | Freq: Four times a day (QID) | RECTAL | Status: DC | PRN

## 2021-08-27 MED ORDER — ONDANSETRON HCL 4 MG PO TABS: 4.0000 mg | ORAL_TABLET | Freq: Four times a day (QID) | ORAL | Status: DC | PRN

## 2021-08-27 MED ORDER — OXYCODONE HCL 5 MG PO TABS
10.0000 mg | ORAL_TABLET | Freq: Four times a day (QID) | ORAL | Status: DC | PRN
  Administered 2021-08-27 – 2021-08-28 (×3): 10 mg via ORAL
  Filled 2021-08-27 (×3): qty 2

## 2021-08-27 MED ORDER — HYDROMORPHONE HCL 1 MG/ML IJ SOLN: 1.0000 mg | INTRAMUSCULAR | Status: DC | PRN

## 2021-08-27 MED ORDER — ACETAMINOPHEN 325 MG PO TABS: 650.0000 mg | ORAL_TABLET | Freq: Four times a day (QID) | ORAL | Status: DC | PRN

## 2021-08-27 MED ORDER — FENTANYL CITRATE PF 50 MCG/ML IJ SOSY
100.0000 ug | PREFILLED_SYRINGE | Freq: Once | INTRAMUSCULAR | Status: AC
  Administered 2021-08-27: 100 ug via INTRAVENOUS
  Filled 2021-08-27: qty 2

## 2021-08-27 MED ORDER — SODIUM CHLORIDE 0.9 % IV SOLN
500.0000 mg | INTRAVENOUS | Status: DC
  Administered 2021-08-27 – 2021-08-29 (×3): 500 mg via INTRAVENOUS
  Filled 2021-08-27 (×3): qty 5

## 2021-08-27 MED ORDER — ALBUTEROL SULFATE (2.5 MG/3ML) 0.083% IN NEBU: 2.5000 mg | INHALATION_SOLUTION | RESPIRATORY_TRACT | Status: DC | PRN

## 2021-08-27 MED ORDER — GUAIFENESIN ER 600 MG PO TB12
1200.0000 mg | ORAL_TABLET | Freq: Two times a day (BID) | ORAL | Status: DC
  Administered 2021-08-27 – 2021-08-29 (×5): 1200 mg via ORAL
  Filled 2021-08-27 (×6): qty 2

## 2021-08-27 MED ORDER — LACTATED RINGERS IV SOLN: INTRAVENOUS | Status: DC

## 2021-08-27 MED ORDER — HYDROMORPHONE HCL 1 MG/ML IJ SOLN
1.0000 mg | Freq: Four times a day (QID) | INTRAMUSCULAR | Status: DC | PRN
  Administered 2021-08-27 – 2021-08-28 (×4): 1 mg via INTRAVENOUS
  Filled 2021-08-27 (×4): qty 1

## 2021-08-27 MED ORDER — SODIUM CHLORIDE 0.9 % IV SOLN
2.0000 g | INTRAVENOUS | Status: DC
  Administered 2021-08-27 – 2021-08-29 (×3): 2 g via INTRAVENOUS
  Filled 2021-08-27 (×3): qty 20

## 2021-08-27 MED ORDER — DOCUSATE SODIUM 100 MG PO CAPS
100.0000 mg | ORAL_CAPSULE | Freq: Two times a day (BID) | ORAL | Status: DC
  Administered 2021-08-27 – 2021-09-01 (×11): 100 mg via ORAL
  Filled 2021-08-27 (×12): qty 1

## 2021-08-27 MED ORDER — ONDANSETRON HCL 4 MG/2ML IJ SOLN: 4.0000 mg | Freq: Four times a day (QID) | INTRAMUSCULAR | Status: DC | PRN

## 2021-08-27 MED ORDER — DIAZEPAM 5 MG PO TABS
5.0000 mg | ORAL_TABLET | Freq: Once | ORAL | Status: AC
  Administered 2021-08-27: 5 mg via ORAL
  Filled 2021-08-27: qty 1

## 2021-08-27 NOTE — ED Notes (Signed)
Patient transported to CT 

## 2021-08-27 NOTE — ED Provider Notes (Signed)
[facility]-EMERGENCY DEPT Provider Note   CSN: [id number] Arrival date & time: 08/27/21  0232     History  Chief Complaint  Patient presents with   Anxiety   Fall    Jordan Adams is a 31 y.o. male.  HPI     31 y.o. male with medical history significant of opioid abuse presents to the emergency room with right chest pain and cough. He reports that he was on a roof and fell off 3 days ago. He landed on his right shoulder and rolled down the driveway.  He has been having right-sided upper extremity pain, scapular pain, left hand pain ever since.  Patient is left-handed dominant.  He went to outside hospital, but left because they were unable to secure an IV.  he reports coughing green mucous and had increased difficulty breathing over the last day.  Patient also has shortness of breath type feeling and pain is worse with deep inspiration.  Home Medications Prior to Admission medications   Medication Sig Start Date End Date Taking? Authorizing Provider  acetaminophen (TYLENOL) 500 MG tablet Take 1,000 mg by mouth every 6 (six) hours as needed for moderate pain.   Yes [provider]  ibuprofen (ADVIL) 800 MG tablet Take 800 mg by mouth every 8 (eight) hours as needed for moderate pain.   Yes [provider]      Allergies    Patient has no active allergies.    Review of Systems   Review of Systems  Constitutional:  Positive for activity change.  Respiratory:  Positive for cough and shortness of breath.   Cardiovascular:  Positive for chest pain.   Physical Exam Updated Vital Signs BP (!) 127/56 (BP Location: Left Arm)    Pulse 92    Temp 97.7 F (36.5 C) (Oral)    Resp 18    Ht 6\' 1"  (1.854 m)    Wt 95.3 kg    SpO2 97%    BMI 27.71 kg/m  Physical Exam Vitals and nursing note reviewed.  Constitutional:      Appearance: He is well-developed.  HENT:     Head: Atraumatic.  Neck:     Comments: No midline c-spine tenderness, pt able to  turn head to 45 degrees bilaterally without any pain and able to flex neck to the chest and extend without any pain or neurologic symptoms.  Cardiovascular:     Rate and Rhythm: Normal rate.  Pulmonary:     Effort: Pulmonary effort is normal.     Breath sounds: Normal breath sounds.  Abdominal:     General: Bowel sounds are normal. There is no distension.     Palpations: Abdomen is soft. There is no mass.     Tenderness: There is no abdominal tenderness. There is no guarding or rebound.  Musculoskeletal:        General: Swelling and tenderness present. No deformity.     Cervical back: Normal range of motion.  Skin:    General: Skin is warm.     Findings: Bruising and erythema present.  Neurological:     Mental Status: He is alert and oriented to person, place, and time.    ED Results / Procedures / Treatments   Labs (all labs ordered are listed, but only abnormal results are displayed) Labs Reviewed  CBC WITH DIFFERENTIAL/PLATELET - Abnormal; Notable for the following components:      Result Value   WBC 13.9 (*)    Platelets 65 (*)  Neutro Abs 11.3 (*)    Monocytes Absolute 1.7 (*)    Abs Immature Granulocytes 0.09 (*)    All other components within normal limits  COMPREHENSIVE METABOLIC PANEL - Abnormal; Notable for the following components:   Sodium 130 (*)    Chloride 97 (*)    CO2 15 (*)    BUN 28 (*)    Creatinine, Ser 1.74 (*)    Calcium 8.6 (*)    Total Protein 8.3 (*)    AST 140 (*)    ALT 139 (*)    Total Bilirubin 2.0 (*)    GFR, Estimated 54 (*)    Anion gap 18 (*)    All other components within normal limits  RESP PANEL BY RT-PCR (FLU A&B, COVID) ARPGX2  CULTURE, BLOOD (ROUTINE X 2)  CULTURE, BLOOD (ROUTINE X 2)  LACTIC ACID, PLASMA  LACTIC ACID, PLASMA  PROTIME-INR  APTT    EKG None  Radiology DG Ribs Unilateral W/Chest Right  Result Date: 08/27/2021 CLINICAL DATA:  Anxiety and fall. EXAM: RIGHT RIBS AND CHEST - 3+ VIEW COMPARISON:  March 05, 2021 FINDINGS: A radiopaque marker was placed at the site of the patient's pain. No fracture or other bone lesions are seen involving the ribs. There is no evidence of pneumothorax or pleural effusion. Moderate severity patchy left perihilar and bibasilar infiltrates are seen. Heart size and mediastinal contours are within normal limits. IMPRESSION: 1. Moderate severity bilateral patchy infiltrates. Given the patient's history of recent trauma, sequelae associated with pulmonary contusions cannot be excluded. 2. No acute osseous abnormality. Electronically Signed   By: Aram Candela M.D.   On: 08/27/2021 03:41   CT Chest W Contrast  Result Date: 08/27/2021 CLINICAL DATA:  Blunt chest trauma. Fell from roof 2 days ago. Shortness of breath and chest pain. EXAM: CT CHEST WITH CONTRAST TECHNIQUE: Multidetector CT imaging of the chest was performed during intravenous contrast administration. RADIATION DOSE REDUCTION: This exam was performed according to the departmental dose-optimization program which includes automated exposure control, adjustment of the mA and/or kV according to patient size and/or use of iterative reconstruction technique. CONTRAST:  60mL OMNIPAQUE IOHEXOL 300 MG/ML  SOLN COMPARISON:  03/04/2021 FINDINGS: Cardiovascular: No significant vascular findings. Normal heart size. No pericardial effusion. Mediastinum/Nodes: No enlarged mediastinal, hilar, or axillary lymph nodes. Thyroid gland, trachea, and esophagus demonstrate no significant findings. Lungs/Pleura: No pleural effusion. No pneumothorax or atelectasis. Extensive multifocal patchy airspace densities are identified throughout the left upper lobe, left lower lobe and to a lesser extent right lower lobe and right middle lobe. A few patchy airspace densities are also noted within the basilar right upper lobe. Imaging findings are concerning for multifocal infection. Upper Abdomen: No acute abnormality Musculoskeletal: The thoracic  vertebral body heights are well maintained without signs of fracture. Sternum is intact. Remote healed left lateral rib fracture, image 105/5. No acute fracture identified. IMPRESSION: 1. No acute post-traumatic findings abnormalities. 2. Extensive multifocal, bilateral airspace densities are identified, left greater than right. Imaging findings are compatible with multifocal infection. 3. Remote healed left lateral rib fracture. Electronically Signed   By: Signa Kell M.D.   On: 08/27/2021 07:40   DG Hand Complete Left  Result Date: 08/27/2021 CLINICAL DATA:  Status post fall. EXAM: LEFT HAND - COMPLETE 3+ VIEW COMPARISON:  August 23, 2020 FINDINGS: There is no evidence of an acute fracture or dislocation. A chronic fracture deformity of the fifth left metacarpal is noted. There is no evidence of  arthropathy or other focal bone abnormality. Soft tissues are unremarkable. IMPRESSION: No acute fracture or dislocation. Electronically Signed   By: Aram Candela M.D.   On: 08/27/2021 03:42    Procedures .Critical Care Performed by: Derwood Kaplan, MD Authorized by: Derwood Kaplan, MD   Critical care provider statement:    Critical care time (minutes):  40   Critical care was time spent personally by me on the following activities:  Development of treatment plan with patient or surrogate, discussions with consultants, evaluation of patient's response to treatment, examination of patient, ordering and review of laboratory studies, ordering and review of radiographic studies, ordering and performing treatments and interventions, pulse oximetry, re-evaluation of patient's condition and review of old charts    Medications Ordered in ED Medications  lactated ringers infusion (has no administration in time range)  cefTRIAXone (ROCEPHIN) 2 g in sodium chloride 0.9 % 100 mL IVPB (has no administration in time range)  azithromycin (ZITHROMAX) 500 mg in sodium chloride 0.9 % 250 mL IVPB (has no  administration in time range)  fentaNYL (SUBLIMAZE) injection 100 mcg (100 mcg Intravenous Given 08/27/21 0655)  diazepam (VALIUM) tablet 5 mg (5 mg Oral Given 08/27/21 0638)  iohexol (OMNIPAQUE) 300 MG/ML solution 75 mL (75 mLs Intravenous Contrast Given 08/27/21 0702)    ED Course/ Medical Decision Making/ A&P                           Medical Decision Making Problems Addressed: Acute respiratory failure with hypoxia [facility]): acute illness or injury that poses a threat to life or bodily functions AKI (acute kidney injury) (HCC): undiagnosed new problem with uncertain prognosis Elevated LFTs: undiagnosed new problem with uncertain prognosis Multifocal pneumonia: acute illness or injury that poses a threat to life or bodily functions  Amount and/or Complexity of Data Reviewed Labs: ordered. Decision-making details documented in ED Course. Radiology: ordered and independent interpretation performed.    Details: X-ray of the chest reviewed independently, patchy infiltrate bilaterally appreciated. ECG/medicine tests: ordered.  Risk Prescription drug management. Parenteral controlled substances. Drug therapy requiring intensive monitoring for toxicity. Decision regarding hospitalization.   31 year old male comes in with chief complaint of right-sided chest pain, shoulder pain.  He also reports shortness of breath and some cough.  Patient had a fall from roof 2 days back.  He had incomplete care at outside hospital.  Comes to the ER because of persistent pain.  On exam he has lower quadrant, bibasilar rales. Patient also noted to be hypoxic, O2 sats drops to mid 80s on room air.  Based on the history, initial concern is that patient might have occult pneumothorax, multiple rib fractures, pulmonary contusion, pneumonia.  X-ray ordered, and reviewed independently.  Equivocal for pulmonary contusion.  CT chest with contrast ordered, it confirms multifocal pneumonia.  Patient was weaned off  of the oxygen, but he became hypoxic again with O2 sats in the 80s.  That is likely why he is feeling anxious.  Will activate code sepsis, admit him to the hospital team.        Final Clinical Impression(s) / ED Diagnoses Final diagnoses:  Multifocal pneumonia  Acute respiratory failure with hypoxia [facility])    Rx / DC Orders ED Discharge Orders     None         Derwood Kaplan, MD 08/28/21 [phone]

## 2021-08-27 NOTE — Sepsis Progress Note (Addendum)
Elink following code sepsis °

## 2021-08-27 NOTE — H&P (Signed)
History and Physical    Chrisopher Pustejovsky HUT:[id number] DOB: 1991-07-01 DOA: 08/27/2021  PCP: Patient, No Pcp Per (Inactive)  Patient coming from: Home  Chief Complaint: right chest pain, cough  HPI: Johnson Arizola is a 31 y.o. male with medical history significant of opioid abuse. Presenting with right chest pain and cough. He reports that he was on a roof and fell off 3 days ago. He landed on his right shoulder and rolled down the driveway. He has had pain in his right axilla and right hand since. He also reports coughing green mucous and had increased difficulty breathing over the last day. He became concerned and went to an OSH yesterday, but left d/t wait and difficulty with IV access. He presented to this ED with these symptoms. He denies any other aggravating or alleviating factors.   ED Course: CT was notable for multifocal PNA and pulmonary contusions. Labs suggestive of sepsis. He was noted to have AKI. He was started on fluids and CAP coverage. TRH was called for admission  Review of Systems:  Denies palpitations, N/V/D, fevers, sick contacts, lightheadedness, dizziness, syncopal episodes. Reports right axillary pain. Review of systems is otherwise negative for all not mentioned in HPI.   PMHx Past Medical History:  Diagnosis Date   Overdose    Seizures (HCC)    Ureterolithiasis     PSHx History reviewed. No pertinent surgical history.  SocHx  reports that he has never smoked. He uses smokeless tobacco. He reports that he does not currently use drugs. He reports that he does not drink alcohol.  No Active Allergies  FamHx No family history on file.  Prior to Admission medications   Medication Sig Start Date End Date Taking? Authorizing Provider  acetaminophen (TYLENOL) 500 MG tablet Take 1,000 mg by mouth every 6 (six) hours as needed for moderate pain.   Yes [provider]  ibuprofen (ADVIL) 800 MG tablet Take 800 mg by mouth every 8 (eight) hours as needed for  moderate pain.   Yes [provider]    Physical Exam: Vitals:   08/27/21 0506 08/27/21 0508 08/27/21 0545 08/27/21 0737  BP:  109/63 112/63 (!) 127/56  Pulse:  94 96 92  Resp:   16 18  Temp:  97.7 F (36.5 C)    TempSrc:  Oral    SpO2: 95% 91% 95% 97%  Weight:    95.3 kg  Height:    6\' 1"  (1.854 m)    General: 31 y.o. male resting in bed in NAD Eyes: PERRL, normal sclera ENMT: Nares patent w/o discharge, orophaynx clear, dentition normal, ears w/o discharge/lesions/ulcers[ multiple abrasions/cuts to face Neck: Supple, trachea midline Cardiovascular: RRR, +S1, S2, no m/g/r, equal pulses throughout Respiratory: decreased at bases, soft scattered rhonchi, slightly increased WOB on 3L [place] GI: BS+, NDNT, no masses noted, no organomegaly noted MSK: No c/c; mild swelling of left hand, abrasions/bruises noted Neuro: A&O x 3, no focal deficits Psyc: Appropriate interaction and affect, calm/cooperative  Labs on Admission: I have personally reviewed following labs and imaging studies  CBC: Recent Labs  Lab 08/27/21 0415  WBC 13.9*  NEUTROABS 11.3*  HGB 15.9  HCT 47.8  MCV 91.6  PLT 65*   Basic Metabolic Panel: Recent Labs  Lab 08/27/21 0415  NA 130*  K 4.2  CL 97*  CO2 15*  GLUCOSE 97  BUN 28*  CREATININE 1.74*  CALCIUM 8.6*   GFR: Estimated Creatinine Clearance: 71.4 mL/min (A) (by C-G formula based on  SCr of 1.74 mg/dL (H)). Liver Function Tests: Recent Labs  Lab 08/27/21 0415  AST 140*  ALT 139*  ALKPHOS 66  BILITOT 2.0*  PROT 8.3*  ALBUMIN 4.5   No results for input(s): LIPASE, AMYLASE in the last 168 hours. No results for input(s): AMMONIA in the last 168 hours. Coagulation Profile: No results for input(s): INR, PROTIME in the last 168 hours. Cardiac Enzymes: No results for input(s): CKTOTAL, CKMB, CKMBINDEX, TROPONINI in the last 168 hours. BNP (last 3 results) No results for input(s): PROBNP in the last 8760 hours. HbA1C: No results for  input(s): HGBA1C in the last 72 hours. CBG: No results for input(s): GLUCAP in the last 168 hours. Lipid Profile: No results for input(s): CHOL, HDL, LDLCALC, TRIG, CHOLHDL, LDLDIRECT in the last 72 hours. Thyroid Function Tests: No results for input(s): TSH, T4TOTAL, FREET4, T3FREE, THYROIDAB in the last 72 hours. Anemia Panel: No results for input(s): VITAMINB12, FOLATE, FERRITIN, TIBC, IRON, RETICCTPCT in the last 72 hours. Urine analysis:    Component Value Date/Time   COLORURINE YELLOW 03/04/2021 1138   APPEARANCEUR HAZY (A) 03/04/2021 1138   LABSPEC 1.019 03/04/2021 1138   PHURINE 5.0 03/04/2021 1138   GLUCOSEU 150 (A) 03/04/2021 1138   HGBUR SMALL (A) 03/04/2021 1138   BILIRUBINUR NEGATIVE 03/04/2021 1138   KETONESUR NEGATIVE 03/04/2021 1138   PROTEINUR 30 (A) 03/04/2021 1138   NITRITE NEGATIVE 03/04/2021 1138   LEUKOCYTESUR NEGATIVE 03/04/2021 1138    Radiological Exams on Admission: DG Ribs Unilateral W/Chest Right  Result Date: 08/27/2021 CLINICAL DATA:  Anxiety and fall. EXAM: RIGHT RIBS AND CHEST - 3+ VIEW COMPARISON:  March 05, 2021 FINDINGS: A radiopaque marker was placed at the site of the patient's pain. No fracture or other bone lesions are seen involving the ribs. There is no evidence of pneumothorax or pleural effusion. Moderate severity patchy left perihilar and bibasilar infiltrates are seen. Heart size and mediastinal contours are within normal limits. IMPRESSION: 1. Moderate severity bilateral patchy infiltrates. Given the patient's history of recent trauma, sequelae associated with pulmonary contusions cannot be excluded. 2. No acute osseous abnormality. Electronically Signed   By: Aram Candela M.D.   On: 08/27/2021 03:41   CT Chest W Contrast  Result Date: 08/27/2021 CLINICAL DATA:  Blunt chest trauma. Fell from roof 2 days ago. Shortness of breath and chest pain. EXAM: CT CHEST WITH CONTRAST TECHNIQUE: Multidetector CT imaging of the chest was performed  during intravenous contrast administration. RADIATION DOSE REDUCTION: This exam was performed according to the departmental dose-optimization program which includes automated exposure control, adjustment of the mA and/or kV according to patient size and/or use of iterative reconstruction technique. CONTRAST:  45mL OMNIPAQUE IOHEXOL 300 MG/ML  SOLN COMPARISON:  03/04/2021 FINDINGS: Cardiovascular: No significant vascular findings. Normal heart size. No pericardial effusion. Mediastinum/Nodes: No enlarged mediastinal, hilar, or axillary lymph nodes. Thyroid gland, trachea, and esophagus demonstrate no significant findings. Lungs/Pleura: No pleural effusion. No pneumothorax or atelectasis. Extensive multifocal patchy airspace densities are identified throughout the left upper lobe, left lower lobe and to a lesser extent right lower lobe and right middle lobe. A few patchy airspace densities are also noted within the basilar right upper lobe. Imaging findings are concerning for multifocal infection. Upper Abdomen: No acute abnormality Musculoskeletal: The thoracic vertebral body heights are well maintained without signs of fracture. Sternum is intact. Remote healed left lateral rib fracture, image 105/5. No acute fracture identified. IMPRESSION: 1. No acute post-traumatic findings abnormalities. 2. Extensive multifocal, bilateral  airspace densities are identified, left greater than right. Imaging findings are compatible with multifocal infection. 3. Remote healed left lateral rib fracture. Electronically Signed   By: Signa Kellaylor  Stroud M.D.   On: 08/27/2021 07:40   DG Hand Complete Left  Result Date: 08/27/2021 CLINICAL DATA:  Status post fall. EXAM: LEFT HAND - COMPLETE 3+ VIEW COMPARISON:  August 23, 2020 FINDINGS: There is no evidence of an acute fracture or dislocation. A chronic fracture deformity of the fifth left metacarpal is noted. There is no evidence of arthropathy or other focal bone abnormality. Soft  tissues are unremarkable. IMPRESSION: No acute fracture or dislocation. Electronically Signed   By: Aram Candelahaddeus  [place] M.D.   On: 08/27/2021 03:42    EKG: None obtained in ED  Assessment/Plan Sepsis secondary to multifocal PNA     - admit to inpt, progressive     - continue CAP coverage w/ azithro, rocephin     - continue fluids     - follow lactic acid, Bld Cx     - add guaifenesin, PRN albuterol     - IS, FV     - wean O2 as able  Fall Pulmonary contusion?     - displays expected pain     - imaging negative for fracture     - spoke with PCCM about contusion; monitor no active intervention  AKI     - as a fxn of sepsis?     - check renal US; continue fluids; watch nephrotoxins  Elevated LFTs     - as a fxn of sepsis?     - check RUQ US, check hepatitis panel  Hyponatremia     - mild, fluids, follow  Thrombocytopenia     - as a fxn of sepsis?     - SCDs for now     - check PBS  Hx of opioid abuse     - he has a history of opioid abuse     - will limit IV narcotics to today only; PO pain relief ordered as well  Seizure disorder     - he denies a Hx of seizure disorder and denies take meds for seizure  DVT prophylaxis: SCDs  Code Status: FULL  Family Communication: None at bedside  Consults called: None   Status is: Inpatient  Remains inpatient appropriate because: severity of illness  Teddy Spikeyrone A Kyona Chauncey DO [facility]  If 7PM-7AM, please contact night-coverage [email or web address]  08/27/2021, 8:36 AM

## 2021-08-27 NOTE — ED Provider Notes (Signed)
Healthy 31 year old fell from roof 2 weeks ago. Low 80s on RA. Satting well on 3L. Needs admission for hypoxic respiratory failure secondary from multifocal PNA.  Physical Exam  BP (!) 127/56 (BP Location: Left Arm)    Pulse 92    Temp 97.7 F (36.5 C) (Oral)    Resp 18    Ht 6\' 1"  (1.854 m)    Wt 95.3 kg    SpO2 97%    BMI 27.71 kg/m   Physical Exam Constitutional:      Appearance: Normal appearance. He is well-developed. He is not ill-appearing, toxic-appearing or diaphoretic.     Interventions: Nasal cannula in place.  HENT:     Head: Normocephalic and atraumatic.     Right Ear: External ear normal.     Left Ear: External ear normal.  Pulmonary:     Effort: Pulmonary effort is normal. No respiratory distress.  Skin:    General: Skin is warm and dry.  Neurological:     General: No focal deficit present.  Psychiatric:        Mood and Affect: Mood normal.        Behavior: Behavior normal.    Procedures  Procedures  ED Course / MDM    Medical Decision Making Amount and/or Complexity of Data Reviewed Labs: ordered. Radiology: ordered. ECG/medicine tests: ordered.  Risk Prescription drug management. Decision regarding hospitalization.   Patient was admitted to hospitalist for further management.       , MD 08/27/21 [phone]

## 2021-08-27 NOTE — ED Triage Notes (Addendum)
Pt arrives EMS from home and reports anxiety for last couple days. Also sts falling 14ft from a roof while working on some gutters 36 hours prior to arrival. C/o right rib pain, left hand pain and right shoulder pain.

## 2021-08-28 DIAGNOSIS — B9561 Methicillin susceptible Staphylococcus aureus infection as the cause of diseases classified elsewhere: Secondary | ICD-10-CM | POA: Diagnosis present

## 2021-08-28 DIAGNOSIS — R7881 Bacteremia: Secondary | ICD-10-CM

## 2021-08-28 LAB — CBC WITH DIFFERENTIAL/PLATELET
Abs Immature Granulocytes: 0.09 10*3/uL — ABNORMAL HIGH (ref 0.00–0.07)
Basophils Absolute: 0 10*3/uL (ref 0.0–0.1)
Basophils Relative: 0 %
Eosinophils Absolute: 0 10*3/uL (ref 0.0–0.5)
Eosinophils Relative: 0 %
HCT: 47.8 % (ref 39.0–52.0)
Hemoglobin: 15.9 g/dL (ref 13.0–17.0)
Immature Granulocytes: 1 %
Lymphocytes Relative: 6 %
Lymphs Abs: 0.8 10*3/uL (ref 0.7–4.0)
MCH: 30.5 pg (ref 26.0–34.0)
MCHC: 33.3 g/dL (ref 30.0–36.0)
MCV: 91.6 fL (ref 80.0–100.0)
Monocytes Absolute: 1.7 10*3/uL — ABNORMAL HIGH (ref 0.1–1.0)
Monocytes Relative: 12 %
Neutro Abs: 11.3 10*3/uL — ABNORMAL HIGH (ref 1.7–7.7)
Neutrophils Relative %: 81 %
Platelets: 65 10*3/uL — ABNORMAL LOW (ref 150–400)
RBC: 5.22 MIL/uL (ref 4.22–5.81)
RDW: 12.9 % (ref 11.5–15.5)
WBC: 13.9 10*3/uL — ABNORMAL HIGH (ref 4.0–10.5)
nRBC: 0 % (ref 0.0–0.2)

## 2021-08-28 LAB — COMPREHENSIVE METABOLIC PANEL
ALT: 143 U/L — ABNORMAL HIGH (ref 0–44)
AST: 173 U/L — ABNORMAL HIGH (ref 15–41)
Albumin: 3.5 g/dL (ref 3.5–5.0)
Alkaline Phosphatase: 48 U/L (ref 38–126)
Anion gap: 8 (ref 5–15)
BUN: 14 mg/dL (ref 6–20)
CO2: 27 mmol/L (ref 22–32)
Calcium: 8.2 mg/dL — ABNORMAL LOW (ref 8.9–10.3)
Chloride: 100 mmol/L (ref 98–111)
Creatinine, Ser: 0.81 mg/dL (ref 0.61–1.24)
GFR, Estimated: >60 mL/min (ref >60)
Glucose, Bld: 103 mg/dL — ABNORMAL HIGH (ref 70–99)
Potassium: 3.7 mmol/L (ref 3.5–5.1)
Sodium: 135 mmol/L (ref 135–145)
Total Bilirubin: 1 mg/dL (ref 0.3–1.2)
Total Protein: 6.4 g/dL — ABNORMAL LOW (ref 6.5–8.1)

## 2021-08-28 LAB — BLOOD CULTURE ID PANEL (REFLEXED) - BCID2
A.calcoaceticus-baumannii: NOT DETECTED
Bacteroides fragilis: NOT DETECTED
Candida albicans: NOT DETECTED
Candida auris: NOT DETECTED
Candida glabrata: NOT DETECTED
Candida krusei: NOT DETECTED
Candida parapsilosis: NOT DETECTED
Candida tropicalis: NOT DETECTED
Cryptococcus neoformans/gattii: NOT DETECTED
Enterobacter cloacae complex: NOT DETECTED
Enterobacterales: NOT DETECTED
Enterococcus Faecium: NOT DETECTED
Enterococcus faecalis: NOT DETECTED
Escherichia coli: NOT DETECTED
Haemophilus influenzae: NOT DETECTED
Klebsiella aerogenes: NOT DETECTED
Klebsiella oxytoca: NOT DETECTED
Klebsiella pneumoniae: NOT DETECTED
Listeria monocytogenes: NOT DETECTED
Meth resistant mecA/C and MREJ: NOT DETECTED
Methicillin resistance mecA/C: NOT DETECTED
Neisseria meningitidis: NOT DETECTED
Proteus species: NOT DETECTED
Pseudomonas aeruginosa: NOT DETECTED
Salmonella species: NOT DETECTED
Serratia marcescens: NOT DETECTED
Staphylococcus aureus (BCID): DETECTED — AB
Staphylococcus epidermidis: DETECTED — AB
Staphylococcus lugdunensis: NOT DETECTED
Staphylococcus species: DETECTED — AB
Stenotrophomonas maltophilia: NOT DETECTED
Streptococcus agalactiae: NOT DETECTED
Streptococcus pneumoniae: NOT DETECTED
Streptococcus pyogenes: NOT DETECTED
Streptococcus species: NOT DETECTED

## 2021-08-28 LAB — CBC
HCT: 39.2 % (ref 39.0–52.0)
Hemoglobin: 13.3 g/dL (ref 13.0–17.0)
MCH: 30.4 pg (ref 26.0–34.0)
MCHC: 33.9 g/dL (ref 30.0–36.0)
MCV: 89.7 fL (ref 80.0–100.0)
Platelets: 188 10*3/uL (ref 150–400)
RBC: 4.37 MIL/uL (ref 4.22–5.81)
RDW: 13 % (ref 11.5–15.5)
WBC: 8 10*3/uL (ref 4.0–10.5)
nRBC: 0 % (ref 0.0–0.2)

## 2021-08-28 LAB — PROTIME-INR
INR: 1 (ref 0.8–1.2)
Prothrombin Time: 13.6 seconds (ref 11.4–15.2)

## 2021-08-28 LAB — HEPATITIS PANEL, ACUTE
HCV Ab: REACTIVE — AB
Hep A IgM: NONREACTIVE
Hep B C IgM: NONREACTIVE
Hepatitis B Surface Ag: NONREACTIVE

## 2021-08-28 LAB — PROCALCITONIN: Procalcitonin: 4.01 ng/mL

## 2021-08-28 LAB — CORTISOL-AM, BLOOD: Cortisol - AM: 7.5 ug/dL (ref 6.7–22.6)

## 2021-08-28 MED ORDER — OXYCODONE HCL 5 MG PO TABS
10.0000 mg | ORAL_TABLET | Freq: Four times a day (QID) | ORAL | Status: DC | PRN
  Administered 2021-08-28 – 2021-08-30 (×5): 10 mg via ORAL
  Filled 2021-08-28 (×6): qty 2

## 2021-08-28 MED ORDER — HYDROMORPHONE HCL 1 MG/ML IJ SOLN
0.5000 mg | INTRAMUSCULAR | Status: DC | PRN
  Administered 2021-08-28 – 2021-08-29 (×3): 1 mg via INTRAVENOUS
  Filled 2021-08-28 (×3): qty 1

## 2021-08-28 MED ORDER — TRAMADOL HCL 50 MG PO TABS
50.0000 mg | ORAL_TABLET | Freq: Four times a day (QID) | ORAL | Status: DC | PRN
  Administered 2021-08-31: 20:00:00 100 mg via ORAL
  Filled 2021-08-28: qty 2

## 2021-08-28 MED ORDER — HYDROMORPHONE HCL 1 MG/ML IJ SOLN: 0.5000 mg | INTRAMUSCULAR | Status: DC | PRN

## 2021-08-28 NOTE — Progress Notes (Signed)
Jordan Adams  KZL:[id number] DOB: [birth year] DOA: 08/27/2021 PCP: Patient, No Pcp Per (Inactive)    Brief Narrative:  30yo with a hx of heroin abuse who presented to the ED with right-sided chest pain and cough after reportedly falling off a roof 3 days prior landing on his right shoulder and rolling down a driveway.  He also reported coughing up green phlegm with increasing shortness of breath over a 24-hour period.  In the ED CT chest revealed multifocal pneumonia and evidence of pulmonary contusions.  He was also noted to have acute kidney injury.  Consultants:  None  Code Status: FULL CODE  Antimicrobials:  Azithromycin 1/21 Ceftriaxone 1/21 >  DVT prophylaxis: SCDs  Interim Hx: T-max 99.2 since admission.  Requiring 3 L nasal cannula support to keep saturations at 92%.  Blood pressure heart rate and respiratory rate stable. Reports ongoing chest wall pain, and sob w/ exertion. Denies n/v or abdom pain.   Assessment & Plan:  Multifocal B pneumonia - sepsis POA - acute hypoxic respiratory failure Clinical source of bacterial infection + heart rate 96 + elevated WBC met criteria for sepsis at admission -continue empiric antibiotic - O2 support as needed  Gram+ Cocci in blood cx BCID suggesting multiple species? - f/u speciation and sensitivities on culture - concerning for true bacteremia, especially w/ risk factors of heroin abuse and current active severe PNA - ID directing workup   Reported fall with possible pulmonary contusions No rib fractures noted on imaging -monitor  Acute kidney injury Resolved with volume expansion consistent with prerenal azotemia related to sepsis  Transaminitis Due to sepsis, or possibly an indication of hepatitis - acute hep panel pending   Hyponatremia Due to hypovolemia -corrected with volume resuscitation  Thrombocytopenia Likely consequence of sepsis  History of heroin abuse Avoid IV narcotics as able -minimize oral pain medications  -viral hepatitis panel pending  Possible Seizure disorder This is listed in the patient's chart but he denies this and is not on prescription medication for seizure    Family Communication: no family present at time of exam  Disposition:   Objective: Blood pressure 132/73, pulse 78, temperature 99.2 F (37.3 C), temperature source Oral, resp. rate 16, height '6\' 1"'  (1.854 m), weight 95.3 kg, SpO2 92 %.  Intake/Output Summary (Last 24 hours) at 08/28/2021 1006 Last data filed at 08/28/2021 0900 Gross per 24 hour  Intake 3232.31 ml  Output 3050 ml  Net 182.31 ml   Filed Weights   08/27/21 0248 08/27/21 0737  Weight: 95.3 kg 95.3 kg    Examination: General: No acute respiratory distress Lungs: fine crackles B - no wheezing  Cardiovascular: Regular rate and rhythm without murmur gallop or rub normal S1 and S2 Abdomen: Nontender, nondistended, soft, bowel sounds positive, no rebound, no ascites, no appreciable mass Extremities: No significant cyanosis, clubbing, or edema bilateral lower extremities  CBC: Recent Labs  Lab 08/27/21 0415 08/28/21 0333  WBC 13.9* 8.0  NEUTROABS 11.3*  --   HGB 15.9 13.3  HCT 47.8 39.2  MCV 91.6 89.7  PLT 65* 390   Basic Metabolic Panel: Recent Labs  Lab 08/27/21 0415 08/28/21 0333  NA 130* 135  K 4.2 3.7  CL 97* 100  CO2 15* 27  GLUCOSE 97 103*  BUN 28* 14  CREATININE 1.74* 0.81  CALCIUM 8.6* 8.2*   GFR: Estimated Creatinine Clearance: 153.4 mL/min (by C-G formula based on SCr of 0.81 mg/dL).  Liver Function Tests: Recent Labs  Lab 08/27/21  9449 08/28/21 0333  AST 140* 173*  ALT 139* 143*  ALKPHOS 66 48  BILITOT 2.0* 1.0  PROT 8.3* 6.4*  ALBUMIN 4.5 3.5    Coagulation Profile: Recent Labs  Lab 08/27/21 0858 08/28/21 0333  INR 1.1 1.0    Recent Results (from the past 240 hour(s))  Resp Panel by RT-PCR (Flu A&B, Covid) Nasopharyngeal Swab     Status: None   Collection Time: 08/27/21  4:15 AM   Specimen:  Nasopharyngeal Swab; Nasopharyngeal(NP) swabs in vial transport medium  Result Value Ref Range Status   SARS Coronavirus 2 by RT PCR NEGATIVE NEGATIVE Final    Comment: (NOTE) SARS-CoV-2 target nucleic acids are NOT DETECTED.  The SARS-CoV-2 RNA is generally detectable in upper respiratory specimens during the acute phase of infection. The lowest concentration of SARS-CoV-2 viral copies this assay can detect is 138 copies/mL. A negative result does not preclude SARS-Cov-2 infection and should not be used as the sole basis for treatment or other patient management decisions. A negative result may occur with  improper specimen collection/handling, submission of specimen other than nasopharyngeal swab, presence of viral mutation(s) within the areas targeted by this assay, and inadequate number of viral copies(<138 copies/mL). A negative result must be combined with clinical observations, patient history, and epidemiological information. The expected result is Negative.  Fact Sheet for Patients:  EntrepreneurPulse.com.au  Fact Sheet for Healthcare Providers:  IncredibleEmployment.be  This test is no t yet approved or cleared by the [place] FDA and  has been authorized for detection and/or diagnosis of SARS-CoV-2 by FDA under an Emergency Use Authorization (EUA). This EUA will remain  in effect (meaning this test can be used) for the duration of the COVID-19 declaration under Section 564(b)(1) of the Act, 21 U.S.C.section 360bbb-3(b)(1), unless the authorization is terminated  or revoked sooner.       Influenza A by PCR NEGATIVE NEGATIVE Final   Influenza B by PCR NEGATIVE NEGATIVE Final    Comment: (NOTE) The Xpert Xpress SARS-CoV-2/FLU/RSV plus assay is intended as an aid in the diagnosis of influenza from Nasopharyngeal swab specimens and should not be used as a sole basis for treatment. Nasal washings and aspirates are unacceptable for  Xpert Xpress SARS-CoV-2/FLU/RSV testing.  Fact Sheet for Patients: EntrepreneurPulse.com.au  Fact Sheet for Healthcare Providers: IncredibleEmployment.be  This test is not yet approved or cleared by the [place] FDA and has been authorized for detection and/or diagnosis of SARS-CoV-2 by FDA under an Emergency Use Authorization (EUA). This EUA will remain in effect (meaning this test can be used) for the duration of the COVID-19 declaration under Section 564(b)(1) of the Act, 21 U.S.C. section 360bbb-3(b)(1), unless the authorization is terminated or revoked.  Performed at [facility], [place] [address], [place], [place] [address]   Blood Culture (routine x 2)     Status: None (Preliminary result)   Collection Time: 08/27/21  8:58 AM   Specimen: BLOOD  Result Value Ref Range Status   Specimen Description   Final    BLOOD BLOOD LEFT FOREARM Performed at [facility] [address] [place], [place] [address]    Special Requests   Final    BOTTLES DRAWN AEROBIC AND ANAEROBIC Blood Culture results may not be optimal due to an inadequate volume of blood received in culture bottles Performed at [facility] [address] [place], [place] [address]    Culture   Final    NO GROWTH <  24 HOURS Performed at [facility], [place] [address] [place], [place] [address]    Report Status PENDING  Incomplete  Blood Culture (routine x 2)     Status: None (Preliminary result)   Collection Time: 08/27/21  9:19 AM   Specimen: BLOOD  Result Value Ref Range Status   Specimen Description   Final    BLOOD BLOOD RIGHT HAND Performed at [facility] [address] [place], [place] [address]    Special Requests   Final    BOTTLES DRAWN AEROBIC AND ANAEROBIC Blood Culture results may not be optimal due to an inadequate volume of blood received in culture bottles Performed at  [facility] [address] [place], [place] [address]    Culture  Setup Time   Final    GRAM POSITIVE COCCI AEROBIC BOTTLE ONLY CRITICAL RESULT CALLED TO, READ BACK BY AND VERIFIED WITH: M LILLISTON,PHARMD'@0649'  08/28/21 [place]    Culture   Final    NO GROWTH < 24 HOURS Performed at [facility], [place] [address] [place], [place] [address]    Report Status PENDING  Incomplete  Blood Culture ID Panel (Reflexed)     Status: Abnormal   Collection Time: 08/27/21  9:19 AM  Result Value Ref Range Status   Enterococcus faecalis NOT DETECTED NOT DETECTED Final   Enterococcus Faecium NOT DETECTED NOT DETECTED Final   Listeria monocytogenes NOT DETECTED NOT DETECTED Final   Staphylococcus species DETECTED (A) NOT DETECTED Final    Comment: CRITICAL RESULT CALLED TO, READ BACK BY AND VERIFIED WITH: M LILLISTON,PHARMD'@0649'  08/28/21 [place]    Staphylococcus aureus (BCID) DETECTED (A) NOT DETECTED Final    Comment: Methicillin (oxacillin) susceptible Staphylococcus aureus (MSSA). Preferred therapy is anti staphylococcal beta lactam antibiotic (Cefazolin or Nafcillin), unless clinically contraindicated. CRITICAL RESULT CALLED TO, READ BACK BY AND VERIFIED WITH: M LILLISTON,PHARMD'@0649'  08/28/21 [place]    Staphylococcus epidermidis DETECTED (A) NOT DETECTED Final    Comment: CRITICAL RESULT CALLED TO, READ BACK BY AND VERIFIED WITH: M LILLISTON,PHARMD'@0649'  08/28/21 [place]    Staphylococcus lugdunensis NOT DETECTED NOT DETECTED Final   Streptococcus species NOT DETECTED NOT DETECTED Final   Streptococcus agalactiae NOT DETECTED NOT DETECTED Final   Streptococcus pneumoniae NOT DETECTED NOT DETECTED Final   Streptococcus pyogenes NOT DETECTED NOT DETECTED Final   A.calcoaceticus-baumannii NOT DETECTED NOT DETECTED Final   Bacteroides fragilis NOT DETECTED NOT DETECTED Final   Enterobacterales NOT DETECTED NOT DETECTED Final   Enterobacter cloacae complex NOT DETECTED NOT DETECTED Final    Escherichia coli NOT DETECTED NOT DETECTED Final   Klebsiella aerogenes NOT DETECTED NOT DETECTED Final   Klebsiella oxytoca NOT DETECTED NOT DETECTED Final   Klebsiella pneumoniae NOT DETECTED NOT DETECTED Final   Proteus species NOT DETECTED NOT DETECTED Final   Salmonella species NOT DETECTED NOT DETECTED Final   Serratia marcescens NOT DETECTED NOT DETECTED Final   Haemophilus influenzae NOT DETECTED NOT DETECTED Final   Neisseria meningitidis NOT DETECTED NOT DETECTED Final   Pseudomonas aeruginosa NOT DETECTED NOT DETECTED Final   Stenotrophomonas maltophilia NOT DETECTED NOT DETECTED Final   Candida albicans NOT DETECTED NOT DETECTED Final   Candida auris NOT DETECTED NOT DETECTED Final   Candida glabrata NOT DETECTED NOT DETECTED Final   Candida krusei NOT DETECTED NOT DETECTED Final   Candida parapsilosis NOT DETECTED NOT DETECTED Final   Candida tropicalis NOT DETECTED NOT DETECTED Final   Cryptococcus neoformans/gattii NOT DETECTED NOT DETECTED Final  Methicillin resistance mecA/C NOT DETECTED NOT DETECTED Final   Meth resistant mecA/C and MREJ NOT DETECTED NOT DETECTED Final    Comment: Performed at [facility], [place] [address], [place], [place] [address]     Scheduled Meds:  docusate sodium  100 mg Oral BID   guaiFENesin  1,200 mg Oral BID   Continuous Infusions:  azithromycin 500 mg (08/27/21 0950)   cefTRIAXone (ROCEPHIN)  IV 2 g (08/28/21 0813)     LOS: 1 day   Cherene Altes, MD [facility] Office  [phone] Pager - Text Page per Shea Evans  If 7PM-7AM, please contact night-coverage per Amion 08/28/2021, 10:06 AM

## 2021-08-28 NOTE — Progress Notes (Signed)
PHARMACY - PHYSICIAN COMMUNICATION CRITICAL VALUE ALERT - BLOOD CULTURE IDENTIFICATION (BCID)  Jordan Adams is an 31 y.o. male who presented to [facility] on 08/27/2021 with a chief complaint of rib pain after fall off ladder.   Assessment:  Admit with hypoxic respiratory failure due to multifocal PNA.  Clinically improving on antibiotics for CAP.  Blood cx now + GPC in aerobic bottle of 1 set; BCID + MSSA, staph epidermidis and Staph species.  Name of physician (or Provider) ContactedThereasa Solo  Current antibiotics: Rocephin + Zithromax  Changes to prescribed antibiotics recommended:  Continue current regimen and monitor for final cx data. Consider re-culture patient.  Would quickly escalate therapy to cover MSSA if patient clinically deteriorates.  Results for orders placed or performed during the hospital encounter of 08/27/21  Blood Culture ID Panel (Reflexed) (Collected: 08/27/2021  9:19 AM)  Result Value Ref Range   Enterococcus faecalis NOT DETECTED NOT DETECTED   Enterococcus Faecium NOT DETECTED NOT DETECTED   Listeria monocytogenes NOT DETECTED NOT DETECTED   Staphylococcus species DETECTED (A) NOT DETECTED   Staphylococcus aureus (BCID) DETECTED (A) NOT DETECTED   Staphylococcus epidermidis DETECTED (A) NOT DETECTED   Staphylococcus lugdunensis NOT DETECTED NOT DETECTED   Streptococcus species NOT DETECTED NOT DETECTED   Streptococcus agalactiae NOT DETECTED NOT DETECTED   Streptococcus pneumoniae NOT DETECTED NOT DETECTED   Streptococcus pyogenes NOT DETECTED NOT DETECTED   A.calcoaceticus-baumannii NOT DETECTED NOT DETECTED   Bacteroides fragilis NOT DETECTED NOT DETECTED   Enterobacterales NOT DETECTED NOT DETECTED   Enterobacter cloacae complex NOT DETECTED NOT DETECTED   Escherichia coli NOT DETECTED NOT DETECTED   Klebsiella aerogenes NOT DETECTED NOT DETECTED   Klebsiella oxytoca NOT DETECTED NOT DETECTED   Klebsiella pneumoniae NOT DETECTED NOT DETECTED    Proteus species NOT DETECTED NOT DETECTED   Salmonella species NOT DETECTED NOT DETECTED   Serratia marcescens NOT DETECTED NOT DETECTED   Haemophilus influenzae NOT DETECTED NOT DETECTED   Neisseria meningitidis NOT DETECTED NOT DETECTED   Pseudomonas aeruginosa NOT DETECTED NOT DETECTED   Stenotrophomonas maltophilia NOT DETECTED NOT DETECTED   Candida albicans NOT DETECTED NOT DETECTED   Candida auris NOT DETECTED NOT DETECTED   Candida glabrata NOT DETECTED NOT DETECTED   Candida krusei NOT DETECTED NOT DETECTED   Candida parapsilosis NOT DETECTED NOT DETECTED   Candida tropicalis NOT DETECTED NOT DETECTED   Cryptococcus neoformans/gattii NOT DETECTED NOT DETECTED   Methicillin resistance mecA/C NOT DETECTED NOT DETECTED   Meth resistant mecA/C and MREJ NOT DETECTED NOT DETECTED    Netta Cedars PharmD 08/28/2021  7:01 AM

## 2021-08-28 NOTE — Consult Note (Addendum)
[facility]    Date of Admission:  08/27/2021     Reason for Consult: mssa bsi    Referring Provider: Sharolyn Douglas    Abx: 1/21-c azith 1/21-c ceftriaxone        Assessment: Mssa bacteremia Pneumonia bilateral Hcv ab positive Lft elevation  31 yo male opioid abuse admitted with cough/chest pain after falling off a roof 3 days ago found to have bilateral multifocal pna and mssa bacteremia and severe sepsis   1/21 blood cx one of two set with both staph epi (no methicillin resistant gene) and mssa   Story a little difficult to string together. I question the pulm opacity on presentation (?too soon for contusion to show like this --> has he been having bacteremia prior and these represent septic emboli). Hcv ab positive but denies ivdu  1/21 hcv antibody positive; hep b/a serology negative 02/2021 hiv screen negative  Has lft elevation and will need to r/o active chronic hep c.   Plan: Repeat blood cx Tte  Hep c rna Repeat hiv screen Can finish 5 day ceftriaxone/azith then transition to cefazolin for mssa bacteremia on 1/25 Discussed with primary team   I spent 60 minute reviewing data/chart, and coordinating care and >50% direct face to face time providing counseling/discussing diagnostics/treatment plan with patient   ------------------------------------------------ Principal Problem:   Multifocal pneumonia    HPI: Jordan Adams is a 31 y.o. male admitted after he fell off the roof, found to have mssa bsi  He was cleaning leaf and slipped on the moss, fall from roof. Had cough immediately and several facial abrasion. Taken to [facility] ED  Was feeling well/normal baseline prior to the fall   Afebrile on presentation Initial wbc 14 Initial bcx mssa Ct chest showed bilateral L>R pulm opacity; presumed cap vs contusion Started on ceftriaxone/azith for CAP  Doing well at this time no focal pain No device/catheter  Denies  ivdu/indu  Hcv ab is positive though    Fam hx: No immunosupression  Social History   Tobacco Use   Smoking status: Never   Smokeless tobacco: Current  Vaping Use   Vaping Use: Never used  Substance Use Topics   Alcohol use: Never   Drug use: Not Currently    Comment: past heroine user    No Active Allergies  Review of Systems: ROS All Other ROS was negative, except mentioned above   Past Medical History:  Diagnosis Date   Overdose    Seizures (HCC)    Ureterolithiasis        Scheduled Meds:  docusate sodium  100 mg Oral BID   guaiFENesin  1,200 mg Oral BID   Continuous Infusions:  azithromycin 500 mg (08/28/21 1118)   cefTRIAXone (ROCEPHIN)  IV 2 g (08/28/21 0813)   PRN Meds:.acetaminophen **OR** [DISCONTINUED] acetaminophen, albuterol, HYDROmorphone (DILAUDID) injection, ondansetron **OR** ondansetron (ZOFRAN) IV, oxyCODONE, traMADol   OBJECTIVE: Blood pressure 132/73, pulse 78, temperature 99.2 F (37.3 C), temperature source Oral, resp. rate 16, height 6\' 1"  (1.854 m), weight 95.3 kg, SpO2 92 %.  Physical Exam General/constitutional: no distress, pleasant HEENT: Normocephalic, PER, Conj Clear, EOMI, Oropharynx clear Neck supple CV: rrr no mrg Lungs: clear to auscultation, normal respiratory effort Abd: Soft, Nontender Ext: no edema Skin: several small facial abrasions Neuro: nonfocal MSK: no peripheral joint swelling/tenderness/warmth; back spines nontender Psych: alert/oriented  Lab Results Lab Results  Component Value Date   WBC 8.0 08/28/2021  HGB 13.3 08/28/2021   HCT 39.2 08/28/2021   MCV 89.7 08/28/2021   PLT 188 08/28/2021    Lab Results  Component Value Date   CREATININE 0.81 08/28/2021   BUN 14 08/28/2021   NA 135 08/28/2021   K 3.7 08/28/2021   CL 100 08/28/2021   CO2 27 08/28/2021    Lab Results  Component Value Date   ALT 143 (H) 08/28/2021   AST 173 (H) 08/28/2021   ALKPHOS 48 08/28/2021   BILITOT 1.0  08/28/2021      Microbiology: Recent Results (from the past 240 hour(s))  Resp Panel by RT-PCR (Flu A&B, Covid) Nasopharyngeal Swab     Status: None   Collection Time: 08/27/21  4:15 AM   Specimen: Nasopharyngeal Swab; Nasopharyngeal(NP) swabs in vial transport medium  Result Value Ref Range Status   SARS Coronavirus 2 by RT PCR NEGATIVE NEGATIVE Final    Comment: (NOTE) SARS-CoV-2 target nucleic acids are NOT DETECTED.  The SARS-CoV-2 RNA is generally detectable in upper respiratory specimens during the acute phase of infection. The lowest concentration of SARS-CoV-2 viral copies this assay can detect is 138 copies/mL. A negative result does not preclude SARS-Cov-2 infection and should not be used as the sole basis for treatment or other patient management decisions. A negative result may occur with  improper specimen collection/handling, submission of specimen other than nasopharyngeal swab, presence of viral mutation(s) within the areas targeted by this assay, and inadequate number of viral copies(<138 copies/mL). A negative result must be combined with clinical observations, patient history, and epidemiological information. The expected result is Negative.  Fact Sheet for Patients:  BloggerCourse.com  Fact Sheet for Healthcare Providers:  SeriousBroker.it  This test is no t yet approved or cleared by the [place] FDA and  has been authorized for detection and/or diagnosis of SARS-CoV-2 by FDA under an Emergency Use Authorization (EUA). This EUA will remain  in effect (meaning this test can be used) for the duration of the COVID-19 declaration under Section 564(b)(1) of the Act, 21 U.S.C.section 360bbb-3(b)(1), unless the authorization is terminated  or revoked sooner.       Influenza A by PCR NEGATIVE NEGATIVE Final   Influenza B by PCR NEGATIVE NEGATIVE Final    Comment: (NOTE) The Xpert Xpress SARS-CoV-2/FLU/RSV  plus assay is intended as an aid in the diagnosis of influenza from Nasopharyngeal swab specimens and should not be used as a sole basis for treatment. Nasal washings and aspirates are unacceptable for Xpert Xpress SARS-CoV-2/FLU/RSV testing.  Fact Sheet for Patients: BloggerCourse.com  Fact Sheet for Healthcare Providers: SeriousBroker.it  This test is not yet approved or cleared by the [place] FDA and has been authorized for detection and/or diagnosis of SARS-CoV-2 by FDA under an Emergency Use Authorization (EUA). This EUA will remain in effect (meaning this test can be used) for the duration of the COVID-19 declaration under Section 564(b)(1) of the Act, 21 U.S.C. section 360bbb-3(b)(1), unless the authorization is terminated or revoked.  Performed at [facility], [address] [place], [place] [address]   Blood Culture (routine x 2)     Status: None (Preliminary result)   Collection Time: 08/27/21  8:58 AM   Specimen: BLOOD  Result Value Ref Range Status   Specimen Description   Final    BLOOD BLOOD LEFT FOREARM Performed at [facility], [address], [place], [place] [address]    Special Requests   Final    BOTTLES DRAWN  AEROBIC AND ANAEROBIC Blood Culture results may not be optimal due to an inadequate volume of blood received in culture bottles Performed at [facility], [address] [place], [place] [address]    Culture   Final    NO GROWTH < 24 HOURS Performed at [facility], [address] [place], [place] [address]    Report Status PENDING  Incomplete  Blood Culture (routine x 2)     Status: None (Preliminary result)   Collection Time: 08/27/21  9:19 AM   Specimen: BLOOD  Result Value Ref Range Status   Specimen Description   Final    BLOOD BLOOD RIGHT HAND Performed at [facility], [address]  [place], [place] [address]    Special Requests   Final    BOTTLES DRAWN AEROBIC AND ANAEROBIC Blood Culture results may not be optimal due to an inadequate volume of blood received in culture bottles Performed at [facility], [address] [place], [place] [address]    Culture  Setup Time   Final    GRAM POSITIVE COCCI AEROBIC BOTTLE ONLY CRITICAL RESULT CALLED TO, READ BACK BY AND VERIFIED WITH: M LILLISTON,PHARMD@[id number]  08/28/21 MK    Culture   Final    NO GROWTH < 24 HOURS Performed at [facility], [address] [place], [place] [address]    Report Status PENDING  Incomplete  Blood Culture ID Panel (Reflexed)     Status: Abnormal   Collection Time: 08/27/21  9:19 AM  Result Value Ref Range Status   Enterococcus faecalis NOT DETECTED NOT DETECTED Final   Enterococcus Faecium NOT DETECTED NOT DETECTED Final   Listeria monocytogenes NOT DETECTED NOT DETECTED Final   Staphylococcus species DETECTED (A) NOT DETECTED Final    Comment: CRITICAL RESULT CALLED TO, READ BACK BY AND VERIFIED WITH: M LILLISTON,PHARMD@[id number]  08/28/21 MK    Staphylococcus aureus (BCID) DETECTED (A) NOT DETECTED Final    Comment: Methicillin (oxacillin) susceptible Staphylococcus aureus (MSSA). Preferred therapy is anti staphylococcal beta lactam antibiotic (Cefazolin or Nafcillin), unless clinically contraindicated. CRITICAL RESULT CALLED TO, READ BACK BY AND VERIFIED WITH: M LILLISTON,PHARMD@[id number]  08/28/21 MK    Staphylococcus epidermidis DETECTED (A) NOT DETECTED Final    Comment: CRITICAL RESULT CALLED TO, READ BACK BY AND VERIFIED WITH: M LILLISTON,PHARMD@[id number]  08/28/21 MK    Staphylococcus lugdunensis NOT DETECTED NOT DETECTED Final   Streptococcus species NOT DETECTED NOT DETECTED Final   Streptococcus agalactiae NOT DETECTED NOT DETECTED Final   Streptococcus pneumoniae NOT DETECTED NOT DETECTED Final   Streptococcus pyogenes NOT DETECTED NOT DETECTED Final    A.calcoaceticus-baumannii NOT DETECTED NOT DETECTED Final   Bacteroides fragilis NOT DETECTED NOT DETECTED Final   Enterobacterales NOT DETECTED NOT DETECTED Final   Enterobacter cloacae complex NOT DETECTED NOT DETECTED Final   Escherichia coli NOT DETECTED NOT DETECTED Final   Klebsiella aerogenes NOT DETECTED NOT DETECTED Final   Klebsiella oxytoca NOT DETECTED NOT DETECTED Final   Klebsiella pneumoniae NOT DETECTED NOT DETECTED Final   Proteus species NOT DETECTED NOT DETECTED Final   Salmonella species NOT DETECTED NOT DETECTED Final   Serratia marcescens NOT DETECTED NOT DETECTED Final   Haemophilus influenzae NOT DETECTED NOT DETECTED Final   Neisseria meningitidis NOT DETECTED NOT DETECTED Final   Pseudomonas aeruginosa NOT DETECTED NOT DETECTED Final   Stenotrophomonas maltophilia NOT DETECTED NOT DETECTED Final   Candida albicans NOT DETECTED NOT DETECTED Final   Candida auris NOT DETECTED NOT  DETECTED Final   Candida glabrata NOT DETECTED NOT DETECTED Final   Candida krusei NOT DETECTED NOT DETECTED Final   Candida parapsilosis NOT DETECTED NOT DETECTED Final   Candida tropicalis NOT DETECTED NOT DETECTED Final   Cryptococcus neoformans/gattii NOT DETECTED NOT DETECTED Final   Methicillin resistance mecA/C NOT DETECTED NOT DETECTED Final   Meth resistant mecA/C and MREJ NOT DETECTED NOT DETECTED Final    Comment: Performed at [facility], [address] [place], [place] [address]     Serology:    Imaging: If present, new imagings (plain films, ct scans, and mri) have been personally visualized and interpreted; radiology reports have been reviewed. Decision making incorporated into the Impression / Recommendations.  1/21 ct chest with contrast 1. No acute post-traumatic findings abnormalities. 2. Extensive multifocal, bilateral airspace densities are identified, left greater than right. Imaging findings are compatible with multifocal infection. 3. Remote  healed left lateral rib fracture.    Raymondo Bandrung T Maiko Salais, MD [facility] [phone] pager    08/28/2021, 1:52 PM

## 2021-08-29 ENCOUNTER — Inpatient Hospital Stay (HOSPITAL_COMMUNITY): Payer: Self-pay

## 2021-08-29 DIAGNOSIS — D696 Thrombocytopenia, unspecified: Secondary | ICD-10-CM

## 2021-08-29 DIAGNOSIS — R7881 Bacteremia: Secondary | ICD-10-CM

## 2021-08-29 DIAGNOSIS — J189 Pneumonia, unspecified organism: Secondary | ICD-10-CM

## 2021-08-29 LAB — CBC
HCT: 42.8 % (ref 39.0–52.0)
Hemoglobin: 14.2 g/dL (ref 13.0–17.0)
MCH: 30.1 pg (ref 26.0–34.0)
MCHC: 33.2 g/dL (ref 30.0–36.0)
MCV: 90.9 fL (ref 80.0–100.0)
Platelets: 118 10*3/uL — ABNORMAL LOW (ref 150–400)
RBC: 4.71 MIL/uL (ref 4.22–5.81)
RDW: 12.8 % (ref 11.5–15.5)
WBC: 4.8 10*3/uL (ref 4.0–10.5)
nRBC: 0 % (ref 0.0–0.2)

## 2021-08-29 LAB — COMPREHENSIVE METABOLIC PANEL
ALT: 134 U/L — ABNORMAL HIGH (ref 0–44)
AST: 94 U/L — ABNORMAL HIGH (ref 15–41)
Albumin: 3.6 g/dL (ref 3.5–5.0)
Alkaline Phosphatase: 46 U/L (ref 38–126)
Anion gap: 7 (ref 5–15)
BUN: 9 mg/dL (ref 6–20)
CO2: 27 mmol/L (ref 22–32)
Calcium: 8.6 mg/dL — ABNORMAL LOW (ref 8.9–10.3)
Chloride: 100 mmol/L (ref 98–111)
Creatinine, Ser: 0.64 mg/dL (ref 0.61–1.24)
GFR, Estimated: >60 mL/min (ref >60)
Glucose, Bld: 97 mg/dL (ref 70–99)
Potassium: 4.1 mmol/L (ref 3.5–5.1)
Sodium: 134 mmol/L — ABNORMAL LOW (ref 135–145)
Total Bilirubin: 1.1 mg/dL (ref 0.3–1.2)
Total Protein: 7.1 g/dL (ref 6.5–8.1)

## 2021-08-29 LAB — ECHOCARDIOGRAM COMPLETE
AV Peak grad: 5.7 mmHg
Ao pk vel: 1.2 m/s
Area-P 1/2: 2.81 cm2
Height: 73 in
S' Lateral: 3.5 cm
Weight: 3360 oz

## 2021-08-29 LAB — MAGNESIUM: Magnesium: 1.9 mg/dL (ref 1.7–2.4)

## 2021-08-29 LAB — HIV ANTIBODY (ROUTINE TESTING W REFLEX): HIV Screen 4th Generation wRfx: NONREACTIVE

## 2021-08-29 MED ORDER — HYDROMORPHONE HCL 1 MG/ML IJ SOLN
0.5000 mg | INTRAMUSCULAR | Status: DC | PRN
  Administered 2021-08-29 – 2021-08-30 (×4): 0.5 mg via INTRAVENOUS
  Filled 2021-08-29 (×4): qty 0.5

## 2021-08-29 MED ORDER — KETOROLAC TROMETHAMINE 30 MG/ML IJ SOLN
30.0000 mg | Freq: Three times a day (TID) | INTRAMUSCULAR | Status: DC
Start: 2021-08-29 — End: 2021-08-31
  Administered 2021-08-29 – 2021-08-31 (×6): 30 mg via INTRAVENOUS
  Filled 2021-08-29 (×6): qty 1

## 2021-08-29 MED ORDER — CEFAZOLIN SODIUM-DEXTROSE 2-4 GM/100ML-% IV SOLN
2.0000 g | Freq: Three times a day (TID) | INTRAVENOUS | Status: DC
  Administered 2021-08-30 – 2021-09-02 (×10): 2 g via INTRAVENOUS
  Filled 2021-08-29 (×11): qty 100

## 2021-08-29 MED ORDER — GUAIFENESIN ER 600 MG PO TB12
1200.0000 mg | ORAL_TABLET | Freq: Two times a day (BID) | ORAL | Status: DC | PRN
  Administered 2021-08-30 (×2): 1200 mg via ORAL
  Filled 2021-08-29 (×2): qty 2

## 2021-08-29 NOTE — Progress Notes (Signed)
Echocardiogram 2D Echocardiogram has been performed.  Eduard Roux 08/29/2021, 12:30 PM

## 2021-08-29 NOTE — TOC Initial Note (Signed)
Transition of Care [facility]) - Initial/Assessment Note    Patient Details  Name: Jordan Adams MRN: [id number] Date of Birth: 1991-03-13  Transition of Care [facility] CM/SW Contact:    Golda Acre, RN Phone Number: 08/29/2021, 8:06 AM  Clinical Narrative:                  Transition of Care [facility]) Screening Note   Patient Details  Name: Jordan Adams Date of Birth: 1991/06/19   [facility]) CM/SW Contact:    Golda Acre, RN Phone Number: 08/29/2021, 8:06 AM    Transition of Care Department [facility]) has reviewed patient and no TOC needs have been identified at this time. We will continue to monitor patient advancement through interdisciplinary progression rounds. If new patient transition needs arise, please place a TOC consult.    Expected Discharge Plan: Home/Self Care Barriers to Discharge: Continued Medical Work up   Patient Goals and CMS Choice Patient states their goals for this hospitalization and ongoing recovery are:: TO GO HOME CMS Medicare.gov Compare Post Acute Care list provided to:: Patient    Expected Discharge Plan and Services Expected Discharge Plan: Home/Self Care   Discharge Planning Services: CM Consult   Living arrangements for the past 2 months: Apartment                                      Prior Living Arrangements/Services Living arrangements for the past 2 months: Apartment Lives with:: Self Patient language and need for interpreter reviewed:: Yes Do you feel safe going back to the place where you live?: Yes            Criminal Activity/Legal Involvement Pertinent to Current Situation/Hospitalization: No - Comment as needed  Activities of Daily Living Home Assistive Devices/Equipment: None ADL Screening (condition at time of admission) Patient's cognitive ability adequate to safely complete daily activities?: Yes Is the patient deaf or have difficulty hearing?: No Does the patient have difficulty seeing, even  when wearing glasses/contacts?: No Does the patient have difficulty concentrating, remembering, or making decisions?: No Patient able to express need for assistance with ADLs?: Yes Does the patient have difficulty dressing or bathing?: No Independently performs ADLs?: Yes (appropriate for developmental age) Does the patient have difficulty walking or climbing stairs?: No Weakness of Legs: None Weakness of Arms/Hands: None  Permission Sought/Granted                  Emotional Assessment Appearance:: Appears stated age Attitude/Demeanor/Rapport: Engaged Affect (typically observed): Calm Orientation: : Oriented to Place, Oriented to Self, Oriented to  Time, Oriented to Situation Alcohol / Substance Use: Not Applicable Psych Involvement: No (comment)  Admission diagnosis:  Elevated LFTs [R79.89] Acute respiratory failure with hypoxia (HCC) [J96.01] AKI (acute kidney injury) (HCC) [N17.9] Multifocal pneumonia [J18.9] Patient Active Problem List   Diagnosis Date Noted   MSSA bacteremia    Multifocal pneumonia 08/27/2021   Opioid overdose (HCC) 03/04/2021   PCP:  Patient, No Pcp Per (Inactive) Pharmacy:   [facility] [address], [address] [place] [address] Phone: [phone] Fax: [phone]  [id number] - [place], [place] - [address] AT [facility] [address] [place] [address] Phone: [phone] Fax: [phone]     Social Determinants of Health (SDOH) Interventions    Readmission Risk  Interventions No flowsheet data found.

## 2021-08-29 NOTE — Progress Notes (Signed)
[facility]   Reason for visit: Follow up on bacteremia  Interval History: 1/4 blood cultures with MSSA; CT chest with multifocal opacities.  WBC 4.8 and remains afebrile.   Day 3 total antibiotics  Physical Exam: Constitutional:  Vitals:   08/28/21 1939 08/29/21 0551  BP: 122/75 118/63  Pulse: 74 68  Resp: 16 16  Temp: 98.5 F (36.9 C) 98.9 F (37.2 C)  SpO2: 98% 98%   patient appears in NAD Respiratory: Normal respiratory effort; CTA B Cardiovascular: RRR GI: soft, nt, nd Skin: multiple abrasions  Review of Systems: Constitutional: negative for fevers and chills Gastrointestinal: negative for nausea and diarrhea  Lab Results  Component Value Date   WBC 4.8 08/29/2021   HGB 14.2 08/29/2021   HCT 42.8 08/29/2021   MCV 90.9 08/29/2021   PLT 118 (L) 08/29/2021    Lab Results  Component Value Date   CREATININE 0.64 08/29/2021   BUN 9 08/29/2021   NA 134 (L) 08/29/2021   K 4.1 08/29/2021   CL 100 08/29/2021   CO2 27 08/29/2021    Lab Results  Component Value Date   ALT 134 (H) 08/29/2021   AST 94 (H) 08/29/2021   ALKPHOS 46 08/29/2021     Microbiology: Recent Results (from the past 240 hour(s))  Resp Panel by RT-PCR (Flu A&B, Covid) Nasopharyngeal Swab     Status: None   Collection Time: 08/27/21  4:15 AM   Specimen: Nasopharyngeal Swab; Nasopharyngeal(NP) swabs in vial transport medium  Result Value Ref Range Status   SARS Coronavirus 2 by RT PCR NEGATIVE NEGATIVE Final    Comment: (NOTE) SARS-CoV-2 target nucleic acids are NOT DETECTED.  The SARS-CoV-2 RNA is generally detectable in upper respiratory specimens during the acute phase of infection. The lowest concentration of SARS-CoV-2 viral copies this assay can detect is 138 copies/mL. A negative result does not preclude SARS-Cov-2 infection and should not be used as the sole basis for treatment or other patient management decisions. A negative result may occur with   improper specimen collection/handling, submission of specimen other than nasopharyngeal swab, presence of viral mutation(s) within the areas targeted by this assay, and inadequate number of viral copies(<138 copies/mL). A negative result must be combined with clinical observations, patient history, and epidemiological information. The expected result is Negative.  Fact Sheet for Patients:  BloggerCourse.com  Fact Sheet for Healthcare Providers:  SeriousBroker.it  This test is no t yet approved or cleared by the [place] FDA and  has been authorized for detection and/or diagnosis of SARS-CoV-2 by FDA under an Emergency Use Authorization (EUA). This EUA will remain  in effect (meaning this test can be used) for the duration of the COVID-19 declaration under Section 564(b)(1) of the Act, 21 U.S.C.section 360bbb-3(b)(1), unless the authorization is terminated  or revoked sooner.       Influenza A by PCR NEGATIVE NEGATIVE Final   Influenza B by PCR NEGATIVE NEGATIVE Final    Comment: (NOTE) The Xpert Xpress SARS-CoV-2/FLU/RSV plus assay is intended as an aid in the diagnosis of influenza from Nasopharyngeal swab specimens and should not be used as a sole basis for treatment. Nasal washings and aspirates are unacceptable for Xpert Xpress SARS-CoV-2/FLU/RSV testing.  Fact Sheet for Patients: BloggerCourse.com  Fact Sheet for Healthcare Providers: SeriousBroker.it  This test is not yet approved or cleared by the [place] FDA and has been authorized for detection and/or diagnosis of SARS-CoV-2 by FDA under an Emergency Use  Authorization (EUA). This EUA will remain in effect (meaning this test can be used) for the duration of the COVID-19 declaration under Section 564(b)(1) of the Act, 21 U.S.C. section 360bbb-3(b)(1), unless the authorization is terminated  or revoked.  Performed at [facility], [place] [address] [place], [place] [address]   Blood Culture (routine x 2)     Status: None (Preliminary result)   Collection Time: 08/27/21  8:58 AM   Specimen: BLOOD  Result Value Ref Range Status   Specimen Description   Final    BLOOD BLOOD LEFT FOREARM Performed at [facility] [address], [place], [place] [address]    Special Requests   Final    BOTTLES DRAWN AEROBIC AND ANAEROBIC Blood Culture results may not be optimal due to an inadequate volume of blood received in culture bottles Performed at [facility] [address], [place], [place] [address]    Culture   Final    NO GROWTH 2 DAYS Performed at [facility] [address], [place], [place] [address]    Report Status PENDING  Incomplete  Blood Culture (routine x 2)     Status: Abnormal (Preliminary result)   Collection Time: 08/27/21  9:19 AM   Specimen: BLOOD  Result Value Ref Range Status   Specimen Description   Final    BLOOD BLOOD RIGHT HAND Performed at [facility] [address], [place], [place] [address]    Special Requests   Final    BOTTLES DRAWN AEROBIC AND ANAEROBIC Blood Culture results may not be optimal due to an inadequate volume of blood received in culture bottles Performed at [facility] [address], [place], [place] [address]    Culture  Setup Time   Final    GRAM POSITIVE COCCI AEROBIC BOTTLE ONLY CRITICAL RESULT CALLED TO, READ BACK BY AND VERIFIED WITH: M LILLISTON,PHARMD@[email or web address]  08/28/21 [place]    Culture (A)  Final    STAPHYLOCOCCUS AUREUS SUSCEPTIBILITIES TO FOLLOW CULTURE REINCUBATED FOR BETTER GROWTH Performed at [facility], [place] [address] [place], [place] [address]    Report Status PENDING  Incomplete  Blood Culture ID Panel (Reflexed)     Status: Abnormal   Collection Time: 08/27/21  9:19 AM  Result Value Ref Range Status    Enterococcus faecalis NOT DETECTED NOT DETECTED Final   Enterococcus Faecium NOT DETECTED NOT DETECTED Final   Listeria monocytogenes NOT DETECTED NOT DETECTED Final   Staphylococcus species DETECTED (A) NOT DETECTED Final    Comment: CRITICAL RESULT CALLED TO, READ BACK BY AND VERIFIED WITH: M LILLISTON,PHARMD@[email or web address]  08/28/21 [place]    Staphylococcus aureus (BCID) DETECTED (A) NOT DETECTED Final    Comment: Methicillin (oxacillin) susceptible Staphylococcus aureus (MSSA). Preferred therapy is anti staphylococcal beta lactam antibiotic (Cefazolin or Nafcillin), unless clinically contraindicated. CRITICAL RESULT CALLED TO, READ BACK BY AND VERIFIED WITH: M LILLISTON,PHARMD@[email or web address]  08/28/21 [place]    Staphylococcus epidermidis DETECTED (A) NOT DETECTED Final    Comment: CRITICAL RESULT CALLED TO, READ BACK BY AND VERIFIED WITH: M LILLISTON,PHARMD@0649  08/28/21 [place]    Staphylococcus lugdunensis NOT DETECTED NOT DETECTED Final   Streptococcus species NOT DETECTED NOT DETECTED Final   Streptococcus agalactiae NOT DETECTED NOT DETECTED Final   Streptococcus pneumoniae NOT DETECTED NOT DETECTED Final   Streptococcus pyogenes NOT DETECTED NOT DETECTED Final   A.calcoaceticus-baumannii NOT DETECTED NOT DETECTED Final   Bacteroides fragilis NOT DETECTED NOT DETECTED Final   Enterobacterales NOT  DETECTED NOT DETECTED Final   Enterobacter cloacae complex NOT DETECTED NOT DETECTED Final   Escherichia coli NOT DETECTED NOT DETECTED Final   Klebsiella aerogenes NOT DETECTED NOT DETECTED Final   Klebsiella oxytoca NOT DETECTED NOT DETECTED Final   Klebsiella pneumoniae NOT DETECTED NOT DETECTED Final   Proteus species NOT DETECTED NOT DETECTED Final   Salmonella species NOT DETECTED NOT DETECTED Final   Serratia marcescens NOT DETECTED NOT DETECTED Final   Haemophilus influenzae NOT DETECTED NOT DETECTED Final   Neisseria meningitidis NOT DETECTED NOT DETECTED Final   Pseudomonas aeruginosa NOT DETECTED NOT  DETECTED Final   Stenotrophomonas maltophilia NOT DETECTED NOT DETECTED Final   Candida albicans NOT DETECTED NOT DETECTED Final   Candida auris NOT DETECTED NOT DETECTED Final   Candida glabrata NOT DETECTED NOT DETECTED Final   Candida krusei NOT DETECTED NOT DETECTED Final   Candida parapsilosis NOT DETECTED NOT DETECTED Final   Candida tropicalis NOT DETECTED NOT DETECTED Final   Cryptococcus neoformans/gattii NOT DETECTED NOT DETECTED Final   Methicillin resistance mecA/C NOT DETECTED NOT DETECTED Final   Meth resistant mecA/C and MREJ NOT DETECTED NOT DETECTED Final    Comment: Performed at [facility], [address] [place], [place] [address]    Impression/Plan:  1. Bacteremia - MSSA in 1/4 bottles.  + opioid abuse and CT chest with findings concerning for septic pulmonary emboli.  TTE done and if no findings on the TTE, I would pursue a TEE to rule out endocarditis.   Repeat blood cultures sent today.    2.  Positive hepatitis C antibody - checking an RNA to see if it is active and in need for treatment.    3.  Thrombocytopenia - appears to be new and so likely secondary to bacteremia/infection.  No signs of cirrhosis on ultrasound.

## 2021-08-29 NOTE — Progress Notes (Signed)
Dai Mcadams  QPY:[id number] DOB: 1991/07/20 DOA: 08/27/2021 PCP: Patient, No Pcp Per (Inactive)    Brief Narrative:  31yo with a hx of heroin abuse who presented to the ED with right-sided chest pain and cough after reportedly falling off a roof 3 days prior landing on his right shoulder and rolling down a driveway.  He also reported coughing up green phlegm with increasing shortness of breath over a 24-hour period.  In the ED CT chest revealed multifocal pneumonia and evidence of pulmonary contusions.  He was also noted to have acute kidney injury.  Consultants:  ID  Code Status: FULL CODE  Antimicrobials:  Azithromycin 1/21 Ceftriaxone 1/21 >  DVT prophylaxis: SCDs  Interim Hx: Tmax 98.9.  Blood pressure stable.  Saturation 98% but requiring 3 L nasal cannula support.  LFTs improved today.  Complains of ongoing chest wall pain.  Some shortness of breath persist.  Denies nausea vomiting or abdominal pain.  Assessment & Plan:  Multifocal B pneumonia - sepsis POA - acute hypoxic respiratory failure Clinical source of bacterial infection + heart rate 96 + elevated WBC met criteria for sepsis at admission -continue empiric antibiotic - O2 support as needed  Staph aureus bacteremia  f/u sensitivities on culture - c/w true bacteremia, especially w/ risk factors of heroin abuse and current active severe PNA - ID directing workup - repeat bld cx and TTE pending   Reported fall with possible pulmonary contusions No rib fractures noted on imaging - monitor  Acute kidney injury Resolved with volume expansion consistent with prerenal azotemia related to sepsis  Transaminitis Due to sepsis or possibly an indication of hepatitis - hep B viral load pending   Hepatitis C+ Viral load pending   Hyponatremia Due to hypovolemia -corrected with volume resuscitation  Thrombocytopenia Likely consequence of sepsis  History of heroin abuse Avoid IV narcotics as able - minimize oral pain  medications   Possible Seizure disorder This is listed in the patient's chart but he denies this and is not on prescription medication for seizure    Family Communication: no family present at time of exam  Disposition:   Objective: Blood pressure 118/63, pulse 68, temperature 98.9 F (37.2 C), temperature source Oral, resp. rate 16, height '6\' 1"'  (1.854 m), weight 95.3 kg, SpO2 98 %.  Intake/Output Summary (Last 24 hours) at 08/29/2021 0858 Last data filed at 08/29/2021 0542 Gross per 24 hour  Intake 830 ml  Output 1700 ml  Net -870 ml    Filed Weights   08/27/21 0248 08/27/21 0737  Weight: 95.3 kg 95.3 kg    Examination: General: No acute respiratory distress Lungs: fine crackles B w/o wheezing  Cardiovascular: Regular rate and rhythm without  Abdomen: Nontender, nondistended, soft, bowel sounds positive, no rebound Extremities: No significant edema bilateral lower extremities  CBC: Recent Labs  Lab 08/27/21 0415 08/28/21 0333  WBC 13.9* 8.0  NEUTROABS 11.3*  --   HGB 15.9 13.3  HCT 47.8 39.2  MCV 91.6 89.7  PLT 65* 124    Basic Metabolic Panel: Recent Labs  Lab 08/27/21 0415 08/28/21 0333 08/29/21 0810  NA 130* 135 134*  K 4.2 3.7 4.1  CL 97* 100 100  CO2 15* 27 27  GLUCOSE 97 103* 97  BUN 28* 14 9  CREATININE 1.74* 0.81 0.64  CALCIUM 8.6* 8.2* 8.6*  MG  --   --  1.9    GFR: Estimated Creatinine Clearance: 155.4 mL/min (by C-G formula based on SCr of  0.64 mg/dL).  Liver Function Tests: Recent Labs  Lab 08/27/21 0415 08/28/21 0333 08/29/21 0810  AST 140* 173* 94*  ALT 139* 143* 134*  ALKPHOS 66 48 46  BILITOT 2.0* 1.0 1.1  PROT 8.3* 6.4* 7.1  ALBUMIN 4.5 3.5 3.6     Coagulation Profile: Recent Labs  Lab 08/27/21 0858 08/28/21 0333  INR 1.1 1.0     Recent Results (from the past 240 hour(s))  Resp Panel by RT-PCR (Flu A&B, Covid) Nasopharyngeal Swab     Status: None   Collection Time: 08/27/21  4:15 AM   Specimen:  Nasopharyngeal Swab; Nasopharyngeal(NP) swabs in vial transport medium  Result Value Ref Range Status   SARS Coronavirus 2 by RT PCR NEGATIVE NEGATIVE Final    Comment: (NOTE) SARS-CoV-2 target nucleic acids are NOT DETECTED.  The SARS-CoV-2 RNA is generally detectable in upper respiratory specimens during the acute phase of infection. The lowest concentration of SARS-CoV-2 viral copies this assay can detect is 138 copies/mL. A negative result does not preclude SARS-Cov-2 infection and should not be used as the sole basis for treatment or other patient management decisions. A negative result may occur with  improper specimen collection/handling, submission of specimen other than nasopharyngeal swab, presence of viral mutation(s) within the areas targeted by this assay, and inadequate number of viral copies(<138 copies/mL). A negative result must be combined with clinical observations, patient history, and epidemiological information. The expected result is Negative.  Fact Sheet for Patients:  EntrepreneurPulse.com.au  Fact Sheet for Healthcare Providers:  IncredibleEmployment.be  This test is no t yet approved or cleared by the [place] FDA and  has been authorized for detection and/or diagnosis of SARS-CoV-2 by FDA under an Emergency Use Authorization (EUA). This EUA will remain  in effect (meaning this test can be used) for the duration of the COVID-19 declaration under Section 564(b)(1) of the Act, 21 U.S.C.section 360bbb-3(b)(1), unless the authorization is terminated  or revoked sooner.       Influenza A by PCR NEGATIVE NEGATIVE Final   Influenza B by PCR NEGATIVE NEGATIVE Final    Comment: (NOTE) The Xpert Xpress SARS-CoV-2/FLU/RSV plus assay is intended as an aid in the diagnosis of influenza from Nasopharyngeal swab specimens and should not be used as a sole basis for treatment. Nasal washings and aspirates are unacceptable for  Xpert Xpress SARS-CoV-2/FLU/RSV testing.  Fact Sheet for Patients: EntrepreneurPulse.com.au  Fact Sheet for Healthcare Providers: IncredibleEmployment.be  This test is not yet approved or cleared by the [place] FDA and has been authorized for detection and/or diagnosis of SARS-CoV-2 by FDA under an Emergency Use Authorization (EUA). This EUA will remain in effect (meaning this test can be used) for the duration of the COVID-19 declaration under Section 564(b)(1) of the Act, 21 U.S.C. section 360bbb-3(b)(1), unless the authorization is terminated or revoked.  Performed at [facility], [place] [address], [place], [place] [address]   Blood Culture (routine x 2)     Status: None (Preliminary result)   Collection Time: 08/27/21  8:58 AM   Specimen: BLOOD  Result Value Ref Range Status   Specimen Description   Final    BLOOD BLOOD LEFT FOREARM Performed at [facility] [address], [place], [place] [address]    Special Requests   Final    BOTTLES DRAWN AEROBIC AND ANAEROBIC Blood Culture results may not be optimal due to an inadequate volume of blood received in culture bottles Performed at [facility], [place] [address] [place], [place] [address]    Culture   Final    NO GROWTH 2 DAYS Performed at [facility] [address] [place], [place] [address]    Report Status PENDING  Incomplete  Blood Culture (routine x 2)     Status: Abnormal (Preliminary result)   Collection Time: 08/27/21  9:19 AM   Specimen: BLOOD  Result Value Ref Range Status   Specimen Description   Final    BLOOD BLOOD RIGHT HAND Performed at [facility] [address], [place], [place] [address]    Special Requests   Final    BOTTLES DRAWN AEROBIC AND ANAEROBIC Blood Culture results may not be optimal due to an inadequate volume of blood received in culture bottles Performed at  [facility] [address] [place], [place] [address]    Culture  Setup Time   Final    GRAM POSITIVE COCCI AEROBIC BOTTLE ONLY CRITICAL RESULT CALLED TO, READ BACK BY AND VERIFIED WITH: M LILLISTON,PHARMD'@[email or web address]'  08/28/21 [place]    Culture (A)  Final    STAPHYLOCOCCUS AUREUS SUSCEPTIBILITIES TO FOLLOW CULTURE REINCUBATED FOR BETTER GROWTH Performed at [facility], [place] [address] [place], [place] [address]    Report Status PENDING  Incomplete  Blood Culture ID Panel (Reflexed)     Status: Abnormal   Collection Time: 08/27/21  9:19 AM  Result Value Ref Range Status   Enterococcus faecalis NOT DETECTED NOT DETECTED Final   Enterococcus Faecium NOT DETECTED NOT DETECTED Final   Listeria monocytogenes NOT DETECTED NOT DETECTED Final   Staphylococcus species DETECTED (A) NOT DETECTED Final    Comment: CRITICAL RESULT CALLED TO, READ BACK BY AND VERIFIED WITH: M LILLISTON,PHARMD'@[email or web address]'  08/28/21 [place]    Staphylococcus aureus (BCID) DETECTED (A) NOT DETECTED Final    Comment: Methicillin (oxacillin) susceptible Staphylococcus aureus (MSSA). Preferred therapy is anti staphylococcal beta lactam antibiotic (Cefazolin or Nafcillin), unless clinically contraindicated. CRITICAL RESULT CALLED TO, READ BACK BY AND VERIFIED WITH: M LILLISTON,PHARMD'@0649'  08/28/21 [place]    Staphylococcus epidermidis DETECTED (A) NOT DETECTED Final    Comment: CRITICAL RESULT CALLED TO, READ BACK BY AND VERIFIED WITH: M LILLISTON,PHARMD'@0649'  08/28/21 [place]    Staphylococcus lugdunensis NOT DETECTED NOT DETECTED Final   Streptococcus species NOT DETECTED NOT DETECTED Final   Streptococcus agalactiae NOT DETECTED NOT DETECTED Final   Streptococcus pneumoniae NOT DETECTED NOT DETECTED Final   Streptococcus pyogenes NOT DETECTED NOT DETECTED Final   A.calcoaceticus-baumannii NOT DETECTED NOT DETECTED Final   Bacteroides fragilis NOT DETECTED NOT DETECTED Final   Enterobacterales NOT DETECTED NOT DETECTED  Final   Enterobacter cloacae complex NOT DETECTED NOT DETECTED Final   Escherichia coli NOT DETECTED NOT DETECTED Final   Klebsiella aerogenes NOT DETECTED NOT DETECTED Final   Klebsiella oxytoca NOT DETECTED NOT DETECTED Final   Klebsiella pneumoniae NOT DETECTED NOT DETECTED Final   Proteus species NOT DETECTED NOT DETECTED Final   Salmonella species NOT DETECTED NOT DETECTED Final   Serratia marcescens NOT DETECTED NOT DETECTED Final   Haemophilus influenzae NOT DETECTED NOT DETECTED Final   Neisseria meningitidis NOT DETECTED NOT DETECTED Final   Pseudomonas aeruginosa NOT DETECTED NOT DETECTED Final   Stenotrophomonas maltophilia NOT DETECTED NOT DETECTED Final   Candida albicans NOT DETECTED NOT DETECTED Final   Candida auris NOT DETECTED NOT DETECTED Final   Candida glabrata NOT DETECTED NOT DETECTED Final   Candida krusei NOT DETECTED NOT DETECTED Final  Candida parapsilosis NOT DETECTED NOT DETECTED Final   Candida tropicalis NOT DETECTED NOT DETECTED Final   Cryptococcus neoformans/gattii NOT DETECTED NOT DETECTED Final   Methicillin resistance mecA/C NOT DETECTED NOT DETECTED Final   Meth resistant mecA/C and MREJ NOT DETECTED NOT DETECTED Final    Comment: Performed at [facility], [place] [address] [place], [place] [address]      Scheduled Meds:  docusate sodium  100 mg Oral BID   guaiFENesin  1,200 mg Oral BID   Continuous Infusions:  azithromycin 500 mg (08/28/21 1118)   cefTRIAXone (ROCEPHIN)  IV 2 g (08/29/21 0850)     LOS: 2 days   Cherene Altes, MD [facility] Office  [phone] Pager - Text Page per Shea Evans  If 7PM-7AM, please contact night-coverage per Amion 08/29/2021, 8:58 AM

## 2021-08-30 MED ORDER — SODIUM CHLORIDE 0.9 % IV SOLN: INTRAVENOUS | Status: DC | PRN

## 2021-08-30 MED ORDER — HYDROMORPHONE HCL 2 MG PO TABS
1.0000 mg | ORAL_TABLET | ORAL | Status: DC | PRN
  Administered 2021-08-30: 17:00:00 1 mg via ORAL
  Administered 2021-08-30 – 2021-09-02 (×11): 2 mg via ORAL
  Filled 2021-08-30 (×13): qty 1

## 2021-08-30 NOTE — Progress Notes (Signed)
End of shift  Pt A & O x4.  Ambulated independently in the room.    Pt receiving Toradol scheduled which helps with pain.  Pt stated oxy didn't help at all.  Dr ordered po dilaudid.   Pt getting IV abx, anticipate TEE on 1/26 and then perhaps discharge.

## 2021-08-30 NOTE — Plan of Care (Signed)

## 2021-08-30 NOTE — Progress Notes (Signed)
Jordan Adams  QMG:[id number] DOB: 06/01/1991 DOA: 08/27/2021 PCP: Patient, No Pcp Per (Inactive)    Brief Narrative:  30yo with a hx of heroin abuse who presented to the ED with right-sided chest pain and cough after reportedly falling off a roof 3 days prior landing on his right shoulder and rolling down a driveway.  He also reported coughing up green phlegm with increasing shortness of breath over a 24-hour period.  In the ED CT chest revealed multifocal pneumonia and evidence of pulmonary contusions.  He was also noted to have acute kidney injury.  Consultants:  ID  Code Status: FULL CODE  Antimicrobials:  Azithromycin 1/21 Ceftriaxone 1/21 >  DVT prophylaxis: SCDs  Interim Hx: T-max 98.4.  Vital signs stable.  Saturation 94% 2 L.  No new labs this morning.  Spoke with cardiology yesterday who informed me the earliest available appointment for a TEE is 09/01/2021. C/o ongoing severe pleuritic chest pain. Denies n/v, abdom pain.   Assessment & Plan:  Multifocal B pneumonia - sepsis POA - acute hypoxic respiratory failure Clinical source of bacterial infection + heart rate 96 + elevated WBC met criteria for sepsis at admission -continue empiric antibiotic as guided by ID - O2 support as needed  Staph aureus bacteremia  Pan-sensitive - risk factors of heroin abuse and current active severe PNA - ID directing workup - TTE unrevealing - TEE scheduled for 09/01/21 - repeat blood cx 08/29/21 thus far w/o growth   Reported fall with possible pulmonary contusions - chest wall pain  No rib fractures noted on imaging  Acute kidney injury Resolved with volume expansion consistent with prerenal azotemia related to sepsis  Transaminitis Due to sepsis or possibly an indication of hepatitis - hep C viral load pending - f/u in AM   Hepatitis C+ Viral load pending - ID following   Hyponatremia Due to hypovolemia - corrected with volume resuscitation  Thrombocytopenia Likely a consequence  of sepsis  History of heroin abuse Avoid IV narcotics as able - minimize oral pain medications   Possible Seizure disorder This is listed in the patient's chart but he denies this and is not on prescription medication for seizure    Family Communication: no family present at time of exam  Disposition:   Objective: Blood pressure 109/71, pulse (!) 49, temperature 98.4 F (36.9 C), temperature source Oral, resp. rate 16, height '6\' 1"'  (1.854 m), weight 95.3 kg, SpO2 94 %.  Intake/Output Summary (Last 24 hours) at 08/30/2021 0848 Last data filed at 08/30/2021 0500 Gross per 24 hour  Intake 400 ml  Output 650 ml  Net -250 ml    Filed Weights   08/27/21 0248 08/27/21 0737  Weight: 95.3 kg 95.3 kg    Examination: General: No acute respiratory distress Lungs: fine crackles B - no wheezing  Cardiovascular: RRR - no M appreciable  Abdomen: Nontender, nondistended, soft, bowel sounds positive, no rebound Extremities: No significant edema B LE   CBC: Recent Labs  Lab 08/27/21 0415 08/28/21 0333 08/29/21 0810  WBC 13.9* 8.0 4.8  NEUTROABS 11.3*  --   --   HGB 15.9 13.3 14.2  HCT 47.8 39.2 42.8  MCV 91.6 89.7 90.9  PLT 65* 188 118*    Basic Metabolic Panel: Recent Labs  Lab 08/27/21 0415 08/28/21 0333 08/29/21 0810  NA 130* 135 134*  K 4.2 3.7 4.1  CL 97* 100 100  CO2 15* 27 27  GLUCOSE 97 103* 97  BUN 28* 14 9  CREATININE 1.74* 0.81 0.64  CALCIUM 8.6* 8.2* 8.6*  MG  --   --  1.9    GFR: Estimated Creatinine Clearance: 155.4 mL/min (by C-G formula based on SCr of 0.64 mg/dL).  Liver Function Tests: Recent Labs  Lab 08/27/21 0415 08/28/21 0333 08/29/21 0810  AST 140* 173* 94*  ALT 139* 143* 134*  ALKPHOS 66 48 46  BILITOT 2.0* 1.0 1.1  PROT 8.3* 6.4* 7.1  ALBUMIN 4.5 3.5 3.6     Coagulation Profile: Recent Labs  Lab 08/27/21 0858 08/28/21 0333  INR 1.1 1.0     Recent Results (from the past 240 hour(s))  Resp Panel by RT-PCR (Flu A&B,  Covid) Nasopharyngeal Swab     Status: None   Collection Time: 08/27/21  4:15 AM   Specimen: Nasopharyngeal Swab; Nasopharyngeal(NP) swabs in vial transport medium  Result Value Ref Range Status   SARS Coronavirus 2 by RT PCR NEGATIVE NEGATIVE Final    Comment: (NOTE) SARS-CoV-2 target nucleic acids are NOT DETECTED.  The SARS-CoV-2 RNA is generally detectable in upper respiratory specimens during the acute phase of infection. The lowest concentration of SARS-CoV-2 viral copies this assay can detect is 138 copies/mL. A negative result does not preclude SARS-Cov-2 infection and should not be used as the sole basis for treatment or other patient management decisions. A negative result may occur with  improper specimen collection/handling, submission of specimen other than nasopharyngeal swab, presence of viral mutation(s) within the areas targeted by this assay, and inadequate number of viral copies(<138 copies/mL). A negative result must be combined with clinical observations, patient history, and epidemiological information. The expected result is Negative.  Fact Sheet for Patients:  EntrepreneurPulse.com.au  Fact Sheet for Healthcare Providers:  IncredibleEmployment.be  This test is no t yet approved or cleared by the [place] FDA and  has been authorized for detection and/or diagnosis of SARS-CoV-2 by FDA under an Emergency Use Authorization (EUA). This EUA will remain  in effect (meaning this test can be used) for the duration of the COVID-19 declaration under Section 564(b)(1) of the Act, 21 U.S.C.section 360bbb-3(b)(1), unless the authorization is terminated  or revoked sooner.       Influenza A by PCR NEGATIVE NEGATIVE Final   Influenza B by PCR NEGATIVE NEGATIVE Final    Comment: (NOTE) The Xpert Xpress SARS-CoV-2/FLU/RSV plus assay is intended as an aid in the diagnosis of influenza from Nasopharyngeal swab specimens and should  not be used as a sole basis for treatment. Nasal washings and aspirates are unacceptable for Xpert Xpress SARS-CoV-2/FLU/RSV testing.  Fact Sheet for Patients: EntrepreneurPulse.com.au  Fact Sheet for Healthcare Providers: IncredibleEmployment.be  This test is not yet approved or cleared by the [place] FDA and has been authorized for detection and/or diagnosis of SARS-CoV-2 by FDA under an Emergency Use Authorization (EUA). This EUA will remain in effect (meaning this test can be used) for the duration of the COVID-19 declaration under Section 564(b)(1) of the Act, 21 U.S.C. section 360bbb-3(b)(1), unless the authorization is terminated or revoked.  Performed at [facility], [place] [address] [place], [place] [address]   Blood Culture (routine x 2)     Status: None (Preliminary result)   Collection Time: 08/27/21  8:58 AM   Specimen: BLOOD  Result Value Ref Range Status   Specimen Description   Final    BLOOD BLOOD LEFT FOREARM Performed at [facility] [address] [place], [place] [address]    Special Requests  Final    BOTTLES DRAWN AEROBIC AND ANAEROBIC Blood Culture results may not be optimal due to an inadequate volume of blood received in culture bottles Performed at [facility], [place] [address], [place], [place] [address]    Culture   Final    NO GROWTH 3 DAYS Performed at [facility], [place] [address] [place], [place] [address]    Report Status PENDING  Incomplete  Blood Culture (routine x 2)     Status: Abnormal (Preliminary result)   Collection Time: 08/27/21  9:19 AM   Specimen: BLOOD  Result Value Ref Range Status   Specimen Description   Final    BLOOD BLOOD RIGHT HAND Performed at [facility] [address] [place], [place] [address]    Special Requests   Final    BOTTLES DRAWN AEROBIC AND ANAEROBIC Blood Culture results may  not be optimal due to an inadequate volume of blood received in culture bottles Performed at [facility] [address] [place], [place] [address]    Culture  Setup Time   Final    GRAM POSITIVE COCCI AEROBIC BOTTLE ONLY CRITICAL RESULT CALLED TO, READ BACK BY AND VERIFIED WITH: M LILLISTON,PHARMD'@0649'  08/28/21 [place]    Culture (A)  Final    STAPHYLOCOCCUS AUREUS CULTURE REINCUBATED FOR BETTER GROWTH Performed at [facility], [address] [place], [place] [address]    Report Status PENDING  Incomplete   Organism ID, Bacteria STAPHYLOCOCCUS AUREUS  Final      Susceptibility   Staphylococcus aureus - MIC*    CIPROFLOXACIN <=0.5 SENSITIVE Sensitive     ERYTHROMYCIN <=0.25 SENSITIVE Sensitive     GENTAMICIN <=0.5 SENSITIVE Sensitive     OXACILLIN <=0.25 SENSITIVE Sensitive     TETRACYCLINE <=1 SENSITIVE Sensitive     VANCOMYCIN <=0.5 SENSITIVE Sensitive     TRIMETH/SULFA <=10 SENSITIVE Sensitive     CLINDAMYCIN <=0.25 SENSITIVE Sensitive     RIFAMPIN <=0.5 SENSITIVE Sensitive     Inducible Clindamycin NEGATIVE Sensitive     * STAPHYLOCOCCUS AUREUS  Blood Culture ID Panel (Reflexed)     Status: Abnormal   Collection Time: 08/27/21  9:19 AM  Result Value Ref Range Status   Enterococcus faecalis NOT DETECTED NOT DETECTED Final   Enterococcus Faecium NOT DETECTED NOT DETECTED Final   Listeria monocytogenes NOT DETECTED NOT DETECTED Final   Staphylococcus species DETECTED (A) NOT DETECTED Final    Comment: CRITICAL RESULT CALLED TO, READ BACK BY AND VERIFIED WITH: M LILLISTON,PHARMD'@0649'  08/28/21 [place]    Staphylococcus aureus (BCID) DETECTED (A) NOT DETECTED Final    Comment: Methicillin (oxacillin) susceptible Staphylococcus aureus (MSSA). Preferred therapy is anti staphylococcal beta lactam antibiotic (Cefazolin or Nafcillin), unless clinically contraindicated. CRITICAL RESULT CALLED TO, READ BACK BY AND VERIFIED WITH: M LILLISTON,PHARMD'@0649'  08/28/21 [place]     Staphylococcus epidermidis DETECTED (A) NOT DETECTED Final    Comment: CRITICAL RESULT CALLED TO, READ BACK BY AND VERIFIED WITH: M LILLISTON,PHARMD'@0649'  08/28/21 [place]    Staphylococcus lugdunensis NOT DETECTED NOT DETECTED Final   Streptococcus species NOT DETECTED NOT DETECTED Final   Streptococcus agalactiae NOT DETECTED NOT DETECTED Final   Streptococcus pneumoniae NOT DETECTED NOT DETECTED Final   Streptococcus pyogenes NOT DETECTED NOT DETECTED Final   A.calcoaceticus-baumannii NOT DETECTED NOT DETECTED Final   Bacteroides fragilis NOT DETECTED NOT DETECTED Final   Enterobacterales NOT DETECTED NOT DETECTED Final   Enterobacter cloacae complex NOT DETECTED NOT DETECTED Final  Escherichia coli NOT DETECTED NOT DETECTED Final   Klebsiella aerogenes NOT DETECTED NOT DETECTED Final   Klebsiella oxytoca NOT DETECTED NOT DETECTED Final   Klebsiella pneumoniae NOT DETECTED NOT DETECTED Final   Proteus species NOT DETECTED NOT DETECTED Final   Salmonella species NOT DETECTED NOT DETECTED Final   Serratia marcescens NOT DETECTED NOT DETECTED Final   Haemophilus influenzae NOT DETECTED NOT DETECTED Final   Neisseria meningitidis NOT DETECTED NOT DETECTED Final   Pseudomonas aeruginosa NOT DETECTED NOT DETECTED Final   Stenotrophomonas maltophilia NOT DETECTED NOT DETECTED Final   Candida albicans NOT DETECTED NOT DETECTED Final   Candida auris NOT DETECTED NOT DETECTED Final   Candida glabrata NOT DETECTED NOT DETECTED Final   Candida krusei NOT DETECTED NOT DETECTED Final   Candida parapsilosis NOT DETECTED NOT DETECTED Final   Candida tropicalis NOT DETECTED NOT DETECTED Final   Cryptococcus neoformans/gattii NOT DETECTED NOT DETECTED Final   Methicillin resistance mecA/C NOT DETECTED NOT DETECTED Final   Meth resistant mecA/C and MREJ NOT DETECTED NOT DETECTED Final    Comment: Performed at [facility], [place] [address] [place], [place] [address]  Culture, blood (routine x 2)      Status: None (Preliminary result)   Collection Time: 08/29/21  8:08 AM   Specimen: BLOOD  Result Value Ref Range Status   Specimen Description   Final    BLOOD LEFT ANTECUBITAL Performed at [facility] [address], [place], [place] [address]    Special Requests   Final    BOTTLES DRAWN AEROBIC AND ANAEROBIC Blood Culture adequate volume Performed at [facility] [address], [place], [place] [address]    Culture   Final    NO GROWTH < 24 HOURS Performed at [facility] [address], [place], [place] [address]    Report Status PENDING  Incomplete  Culture, blood (routine x 2)     Status: None (Preliminary result)   Collection Time: 08/29/21  8:18 AM   Specimen: BLOOD  Result Value Ref Range Status   Specimen Description   Final    BLOOD RIGHT ANTECUBITAL Performed at [facility] [address] [place], [place] [address]    Special Requests   Final    BOTTLES DRAWN AEROBIC AND ANAEROBIC Blood Culture adequate volume Performed at [facility] [address] [place], [place] [address]    Culture   Final    NO GROWTH < 24 HOURS Performed at [facility] [address], [place],  [address]    Report Status PENDING  Incomplete      Scheduled Meds:  docusate sodium  100 mg Oral BID   ketorolac  30 mg Intravenous Q8H   Continuous Infusions:   ceFAZolin (ANCEF) IV 2 g (08/30/21 0639)     LOS: 3 days   Cherene Altes, MD [facility] Office  [phone] Pager - Text Page per Shea Evans  If 7PM-7AM, please contact night-coverage per Amion 08/30/2021, 8:48 AM

## 2021-08-30 NOTE — Plan of Care (Signed)
Pt pain managed well with PRN medications. Pt educated on splinting with usage of IS. Pt demonstrated understanding. Pt voiding without difficulty. Continues on 2L via [place]. Denies any further needs at this time.

## 2021-08-31 DIAGNOSIS — R768 Other specified abnormal immunological findings in serum: Secondary | ICD-10-CM | POA: Diagnosis present

## 2021-08-31 DIAGNOSIS — E663 Overweight: Secondary | ICD-10-CM

## 2021-08-31 DIAGNOSIS — N179 Acute kidney failure, unspecified: Secondary | ICD-10-CM

## 2021-08-31 DIAGNOSIS — F1910 Other psychoactive substance abuse, uncomplicated: Secondary | ICD-10-CM

## 2021-08-31 LAB — COMPREHENSIVE METABOLIC PANEL
ALT: 19 U/L (ref 0–44)
AST: 18 U/L (ref 15–41)
Albumin: 3.9 g/dL (ref 3.5–5.0)
Alkaline Phosphatase: 58 U/L (ref 38–126)
Anion gap: 10 (ref 5–15)
BUN: 36 mg/dL — ABNORMAL HIGH (ref 6–20)
CO2: 20 mmol/L — ABNORMAL LOW (ref 22–32)
Calcium: 8.4 mg/dL — ABNORMAL LOW (ref 8.9–10.3)
Chloride: 108 mmol/L (ref 98–111)
Creatinine, Ser: 1.58 mg/dL — ABNORMAL HIGH (ref 0.61–1.24)
GFR, Estimated: >60 mL/min (ref >60)
Glucose, Bld: 176 mg/dL — ABNORMAL HIGH (ref 70–99)
Potassium: 5.1 mmol/L (ref 3.5–5.1)
Sodium: 138 mmol/L (ref 135–145)
Total Bilirubin: 0.9 mg/dL (ref 0.3–1.2)
Total Protein: 6.8 g/dL (ref 6.5–8.1)

## 2021-08-31 LAB — CULTURE, BLOOD (ROUTINE X 2)

## 2021-08-31 LAB — CBC
HCT: 39.8 % (ref 39.0–52.0)
Hemoglobin: 13.5 g/dL (ref 13.0–17.0)
MCH: 31.3 pg (ref 26.0–34.0)
MCHC: 33.9 g/dL (ref 30.0–36.0)
MCV: 92.1 fL (ref 80.0–100.0)
Platelets: 139 10*3/uL — ABNORMAL LOW (ref 150–400)
RBC: 4.32 MIL/uL (ref 4.22–5.81)
RDW: 15.2 % (ref 11.5–15.5)
WBC: 9.4 10*3/uL (ref 4.0–10.5)
nRBC: 0 % (ref 0.0–0.2)

## 2021-08-31 LAB — HCV RNA QUANT

## 2021-08-31 NOTE — Progress Notes (Signed)
Pt ambulated in the hallway, walked an entire loop on the west side, returned to room and showered.  He verbally expressed pain in his chest but did not appear to be having a difficult time breathing.

## 2021-08-31 NOTE — Assessment & Plan Note (Addendum)
TEE negative for endocarditis.  Patient completed 7 days of IV Ancef.  He will be discharged on 10 more days of p.o. cefadroxil 1 g twice daily.

## 2021-08-31 NOTE — Assessment & Plan Note (Signed)
Counseled.  This includes IV drug use

## 2021-08-31 NOTE — Progress Notes (Addendum)
° ° °  [facility] has been requested to perform a transesophageal echocardiogram on this patient for bacteremia.  After careful review of history and examination, the risks and benefits of transesophageal echocardiogram have been explained including risks of esophageal damage, perforation (1:10,000 risk), bleeding, pharyngeal hematoma as well as other potential complications associated with anesthesia including aspiration, arrhythmia, respiratory failure and death. Alternatives to treatment were discussed and the patient did not have any questions. He states he is willing to proceed. Scheduled tomorrow at 11am at [facility] with Dr. Jens Som. Orders written.  Laurann [place], PA-C 08/31/2021 9:33 AM

## 2021-08-31 NOTE — Progress Notes (Signed)
[facility]   Reason for visit: Follow up on bacteremia  Interval History: no acute events.  WBC 9.4, remains afebrile.  No new complaints.  Day 5 total antibiotics  Physical Exam: Constitutional:  Vitals:   08/31/21 0441 08/31/21 0811  BP: 132/74   Pulse: 65   Resp: 19   Temp: 98.2 F (36.8 C)   SpO2: 99% 97%   patient appears in NAD Respiratory: Normal respiratory effort; CTA B Cardiovascular: RRR GI: soft, nt, nd  Review of Systems: Constitutional: negative for fevers and chills Gastrointestinal: negative for diarrhea Integument/breast: negative for rash  Lab Results  Component Value Date   WBC 9.4 08/31/2021   HGB 13.5 08/31/2021   HCT 39.8 08/31/2021   MCV 92.1 08/31/2021   PLT 139 (L) 08/31/2021    Lab Results  Component Value Date   CREATININE 1.58 (H) 08/31/2021   BUN 36 (H) 08/31/2021   NA 138 08/31/2021   K 5.1 08/31/2021   CL 108 08/31/2021   CO2 20 (L) 08/31/2021    Lab Results  Component Value Date   ALT 19 08/31/2021   AST 18 08/31/2021   ALKPHOS 58 08/31/2021     Microbiology: Recent Results (from the past 240 hour(s))  Resp Panel by RT-PCR (Flu A&B, Covid) Nasopharyngeal Swab     Status: None   Collection Time: 08/27/21  4:15 AM   Specimen: Nasopharyngeal Swab; Nasopharyngeal(NP) swabs in vial transport medium  Result Value Ref Range Status   SARS Coronavirus 2 by RT PCR NEGATIVE NEGATIVE Final    Comment: (NOTE) SARS-CoV-2 target nucleic acids are NOT DETECTED.  The SARS-CoV-2 RNA is generally detectable in upper respiratory specimens during the acute phase of infection. The lowest concentration of SARS-CoV-2 viral copies this assay can detect is 138 copies/mL. A negative result does not preclude SARS-Cov-2 infection and should not be used as the sole basis for treatment or other patient management decisions. A negative result may occur with  improper specimen collection/handling, submission of  specimen other than nasopharyngeal swab, presence of viral mutation(s) within the areas targeted by this assay, and inadequate number of viral copies(<138 copies/mL). A negative result must be combined with clinical observations, patient history, and epidemiological information. The expected result is Negative.  Fact Sheet for Patients:  BloggerCourse.com  Fact Sheet for Healthcare Providers:  SeriousBroker.it  This test is no t yet approved or cleared by the [place] FDA and  has been authorized for detection and/or diagnosis of SARS-CoV-2 by FDA under an Emergency Use Authorization (EUA). This EUA will remain  in effect (meaning this test can be used) for the duration of the COVID-19 declaration under Section 564(b)(1) of the Act, 21 U.S.C.section 360bbb-3(b)(1), unless the authorization is terminated  or revoked sooner.       Influenza A by PCR NEGATIVE NEGATIVE Final   Influenza B by PCR NEGATIVE NEGATIVE Final    Comment: (NOTE) The Xpert Xpress SARS-CoV-2/FLU/RSV plus assay is intended as an aid in the diagnosis of influenza from Nasopharyngeal swab specimens and should not be used as a sole basis for treatment. Nasal washings and aspirates are unacceptable for Xpert Xpress SARS-CoV-2/FLU/RSV testing.  Fact Sheet for Patients: BloggerCourse.com  Fact Sheet for Healthcare Providers: SeriousBroker.it  This test is not yet approved or cleared by the [place] FDA and has been authorized for detection and/or diagnosis of SARS-CoV-2 by FDA under an Emergency Use Authorization (EUA). This EUA will remain in effect (meaning  this test can be used) for the duration of the COVID-19 declaration under Section 564(b)(1) of the Act, 21 U.S.C. section 360bbb-3(b)(1), unless the authorization is terminated or revoked.  Performed at [facility], [place]  [address] [place], [place] [address]   Blood Culture (routine x 2)     Status: None (Preliminary result)   Collection Time: 08/27/21  8:58 AM   Specimen: BLOOD  Result Value Ref Range Status   Specimen Description   Final    BLOOD BLOOD LEFT FOREARM Performed at [place] [address], [place], [place] [address]    Special Requests   Final    BOTTLES DRAWN AEROBIC AND ANAEROBIC Blood Culture results may not be optimal due to an inadequate volume of blood received in culture bottles Performed at [place] [address] [place], [place] [address]    Culture   Final    NO GROWTH 4 DAYS Performed at [facility], [place] [address] [place], [place] [address]    Report Status PENDING  Incomplete  Blood Culture (routine x 2)     Status: Abnormal   Collection Time: 08/27/21  9:19 AM   Specimen: BLOOD  Result Value Ref Range Status   Specimen Description   Final    BLOOD BLOOD RIGHT HAND Performed at [place] [address], [place], [place] [address]    Special Requests   Final    BOTTLES DRAWN AEROBIC AND ANAEROBIC Blood Culture results may not be optimal due to an inadequate volume of blood received in culture bottles Performed at [facility] [address] [place], [place] [address]    Culture  Setup Time   Final    GRAM POSITIVE COCCI AEROBIC BOTTLE ONLY CRITICAL RESULT CALLED TO, READ BACK BY AND VERIFIED WITH: M LILLISTON,PHARMD@[id number]  08/28/21 [place]    Culture (A)  Final    STAPHYLOCOCCUS AUREUS STAPHYLOCOCCUS EPIDERMIDIS THE SIGNIFICANCE OF ISOLATING THIS ORGANISM FROM A SINGLE SET OF BLOOD CULTURES WHEN MULTIPLE SETS ARE DRAWN IS UNCERTAIN. PLEASE NOTIFY THE MICROBIOLOGY DEPARTMENT WITHIN ONE WEEK IF SPECIATION AND SENSITIVITIES ARE REQUIRED. Performed at [facility], [place] [address], [place], [place] [address]    Report Status 08/31/2021 FINAL  Final   Organism ID,  Bacteria STAPHYLOCOCCUS AUREUS  Final      Susceptibility   Staphylococcus aureus - MIC*    CIPROFLOXACIN <=0.5 SENSITIVE Sensitive     ERYTHROMYCIN <=0.25 SENSITIVE Sensitive     GENTAMICIN <=0.5 SENSITIVE Sensitive     OXACILLIN <=0.25 SENSITIVE Sensitive     TETRACYCLINE <=1 SENSITIVE Sensitive     VANCOMYCIN <=0.5 SENSITIVE Sensitive     TRIMETH/SULFA <=10 SENSITIVE Sensitive     CLINDAMYCIN <=0.25 SENSITIVE Sensitive     RIFAMPIN <=0.5 SENSITIVE Sensitive     Inducible Clindamycin NEGATIVE Sensitive     * STAPHYLOCOCCUS AUREUS  Blood Culture ID Panel (Reflexed)     Status: Abnormal   Collection Time: 08/27/21  9:19 AM  Result Value Ref Range Status   Enterococcus faecalis NOT DETECTED NOT DETECTED Final   Enterococcus Faecium NOT DETECTED NOT DETECTED Final   Listeria monocytogenes NOT DETECTED NOT DETECTED Final   Staphylococcus species DETECTED (A) NOT DETECTED Final    Comment: CRITICAL RESULT CALLED TO, READ BACK BY AND VERIFIED WITH: M LILLISTON,PHARMD@[id number]  08/28/21 [place]    Staphylococcus aureus (BCID) DETECTED (A) NOT DETECTED Final    Comment: Methicillin (oxacillin) susceptible Staphylococcus  aureus (MSSA). Preferred therapy is anti staphylococcal beta lactam antibiotic (Cefazolin or Nafcillin), unless clinically contraindicated. CRITICAL RESULT CALLED TO, READ BACK BY AND VERIFIED WITH: M LILLISTON,PHARMD@[id number]  08/28/21 [place]    Staphylococcus epidermidis DETECTED (A) NOT DETECTED Final    Comment: CRITICAL RESULT CALLED TO, READ BACK BY AND VERIFIED WITH: M LILLISTON,PHARMD@[id number]  08/28/21 [place]    Staphylococcus lugdunensis NOT DETECTED NOT DETECTED Final   Streptococcus species NOT DETECTED NOT DETECTED Final   Streptococcus agalactiae NOT DETECTED NOT DETECTED Final   Streptococcus pneumoniae NOT DETECTED NOT DETECTED Final   Streptococcus pyogenes NOT DETECTED NOT DETECTED Final   A.calcoaceticus-baumannii NOT DETECTED NOT DETECTED Final   Bacteroides fragilis NOT  DETECTED NOT DETECTED Final   Enterobacterales NOT DETECTED NOT DETECTED Final   Enterobacter cloacae complex NOT DETECTED NOT DETECTED Final   Escherichia coli NOT DETECTED NOT DETECTED Final   Klebsiella aerogenes NOT DETECTED NOT DETECTED Final   Klebsiella oxytoca NOT DETECTED NOT DETECTED Final   Klebsiella pneumoniae NOT DETECTED NOT DETECTED Final   Proteus species NOT DETECTED NOT DETECTED Final   Salmonella species NOT DETECTED NOT DETECTED Final   Serratia marcescens NOT DETECTED NOT DETECTED Final   Haemophilus influenzae NOT DETECTED NOT DETECTED Final   Neisseria meningitidis NOT DETECTED NOT DETECTED Final   Pseudomonas aeruginosa NOT DETECTED NOT DETECTED Final   Stenotrophomonas maltophilia NOT DETECTED NOT DETECTED Final   Candida albicans NOT DETECTED NOT DETECTED Final   Candida auris NOT DETECTED NOT DETECTED Final   Candida glabrata NOT DETECTED NOT DETECTED Final   Candida krusei NOT DETECTED NOT DETECTED Final   Candida parapsilosis NOT DETECTED NOT DETECTED Final   Candida tropicalis NOT DETECTED NOT DETECTED Final   Cryptococcus neoformans/gattii NOT DETECTED NOT DETECTED Final   Methicillin resistance mecA/C NOT DETECTED NOT DETECTED Final   Meth resistant mecA/C and MREJ NOT DETECTED NOT DETECTED Final    Comment: Performed at [facility], [address] [place], [place] [address]  Culture, blood (routine x 2)     Status: None (Preliminary result)   Collection Time: 08/29/21  8:08 AM   Specimen: BLOOD  Result Value Ref Range Status   Specimen Description   Final    BLOOD LEFT ANTECUBITAL Performed at [place] [address], [place], [place] [address]    Special Requests   Final    BOTTLES DRAWN AEROBIC AND ANAEROBIC Blood Culture adequate volume Performed at [place] [address], [place], [place] [address]    Culture   Final    NO GROWTH 2 DAYS Performed at [place] [address], [place], [place] [address]    Report Status PENDING  Incomplete  Culture, blood (routine x 2)     Status: None (Preliminary result)   Collection Time: 08/29/21  8:18 AM   Specimen: BLOOD  Result Value Ref Range Status   Specimen Description   Final    BLOOD RIGHT ANTECUBITAL Performed at [facility] [place] [address], [place], [place] [address]    Special Requests   Final    BOTTLES DRAWN AEROBIC AND ANAEROBIC Blood Culture adequate volume Performed at [place] [address] [place], [place] [address]    Culture   Final    NO GROWTH 2 DAYS Performed at [facility] [address], [place], [place] [address]    Report Status PENDING  Incomplete    Impression/Plan:  1.  MSSA bacteremia - initial blood cultures 1/4 with Staph aureus and Staph epidermidis and also on BCID.  Repeat blood cultures remain ngtd.   CT chest with multifocal opacities, history of IVDU.   Concern for septic emboli and TEE scheduled for tomorrow.  Continue with cefazolin.   2. Leukocytosis - resolved and remains wnl today at 9.4.  will continue to monitor.   3.  Hepatitis C antibody positive - HCV RNA pending to determine if he will need treatment.

## 2021-08-31 NOTE — Plan of Care (Addendum)
Pt pain well managed this shift with scheduled and PRN medications. Continues to utilize the IS. VSS on 2LNC. Voiding without difficulty. Still reports no BM since prior to admission. Denies any further needs at this time.

## 2021-08-31 NOTE — Progress Notes (Signed)
[facility] Progress Note ° °Patient: Jordan Adams    MRN:[id number]  DOA: 08/27/2021    °Date of Service: the patient was seen and examined on 08/31/2021 ° °Brief hospital course: °30-year-old with history of heroin abuse who presented to the emergency department on 1/21 with complaints of right-sided chest pain and CT scan of chest revealed multifocal pneumonia causing acute respiratory failure with hypoxia and acute kidney injury.  Patient found to be in sepsis.  Started on IV fluids and antibiotics and admitted to the hospitalist service.  Patient found to have staph bacteremia with collection 2D echo unrevealing.  Cardiology plans to take patient for TEE on 1/26. ° °Assessment and Plan: °* MSSA bacteremia- (present on admission) °TEE tomorrow to rule out endocarditis.  High risk given history of IV drug use.  Continue Ancef. ° °Multifocal pneumonia- (present on admission) °Improving.  Breathing comfortably on room air. ° °Polysubstance abuse (HCC)- (present on admission) °Counseled.  This includes IV drug use ° °Overweight (BMI 25.0-29.9)- (present on admission) °Meets criteria BMI greater than 25 ° °Hepatitis C antibody: Reactive.  Quantitative pending ° °Body mass index is 27.71 kg/m².  °  °   ° °Consultants: °Infectious disease °Cardiology ° °Procedures: °2D echo done 1/23: Unremarkable °Plan TEE 1/26 ° °Antimicrobials: °IV Ancef with end date to be determined following TEE °Completed 5-day course of Rocephin/Zithromax ° °Code Status: Full code ° ° °Subjective: Patient with no complaints, mild cough ° °Objective: °Vital signs were reviewed and unremarkable. °Vitals:  ° 08/31/21 0441 08/31/21 0811  °BP: 132/74   °Pulse: 65   °Resp: 19   °Temp: 98.2 °F (36.8 °C)   °SpO2: 99% 97%  ° ° °Intake/Output Summary (Last 24 hours) at 08/31/2021 1717 °Last data filed at 08/31/2021 0441 °Gross per 24 hour  °Intake 240 ml  °Output 400 ml  °Net -160 ml  ° °Filed Weights  ° 08/27/21 0248 08/27/21 0737  °Weight: 95.3 kg  95.3 kg  ° °Body mass index is 27.71 kg/m². ° °Exam: ° °General: Alert and oriented x3, no acute distress °HEENT: Normocephalic and atraumatic, mucous membranes are moist °Cardiovascular: Regular rate and rhythm, S1-S2 °Respiratory: Clear to auscultation bilaterally °Abdomen: Soft, nontender, nondistended, positive bowel sounds °Musculoskeletal: No clubbing or cyanosis or edema °Skin: No skin breaks, tears or lesions °Psychiatry: Appropriate, no evidence of psychoses °Neurology: No focal deficits ° °Data Reviewed: °My review of labs, imaging, notes and other tests shows no new significant findings.  ° °Disposition:  °Status is: Inpatient ° °Remains inpatient appropriate because: TEE ° ° ° °Family Communication: Declined for me to call family °DVT Prophylaxis: °SCDs Start: 08/27/21 1526 ° ° ° °Author: °Jabbar Palmero K Darbi Chandran ,MD °08/31/2021 5:17 PM ° °To reach On-call, see care teams to locate the attending and reach out via [email or web address]. °Between 7PM-7AM, please contact night-coverage °If you still have difficulty reaching the attending provider, please page the [facility] (Director on Call) for [facility] on amion for assistance. ° °

## 2021-08-31 NOTE — Assessment & Plan Note (Signed)
Improving.  Breathing comfortably on room air.

## 2021-08-31 NOTE — Progress Notes (Signed)
End of shift  Pt ambulated in the hallway on room air independently, verbally expressed chest discomfort.  Pt had a shower today.    Pt's Toradol d/c because AKI with SCr increase from 0.64 to 1.54 which may have contributed to the spike.   Pt received PO dilaudid, brought pain down to a 5, which is the lowest pain score he has given in 48hrs.  Pt going for TEE 1/26 at [facility].  Transport will pick him up at 0900, TEE is scheduled at 11:00.  Consent obtained and in the chart.

## 2021-08-31 NOTE — Hospital Course (Addendum)
31 year old with history of heroin abuse who presented to the emergency department on 1/21 with complaints of right-sided chest pain and CT scan of chest revealed multifocal pneumonia causing acute respiratory failure with hypoxia and acute kidney injury.  Patient found to be in sepsis.  Started on IV fluids and antibiotics and admitted to the hospitalist service.  Patient found to have staph bacteremia with collection 2D echo unrevealing.  Patient underwent TEE on 1/26 which was unremarkable for any vegetations fortunately.  By 1/27, patient had completed 7-day course of IV antibiotics.  He will be discharged home with 10 more days of p.o. antibiotics.

## 2021-08-31 NOTE — Assessment & Plan Note (Signed)
Meets criteria BMI greater than 25 

## 2021-08-31 NOTE — Assessment & Plan Note (Addendum)
Initial quantitative level ordered inconclusive.  Re-ordered.  ID will follow up.

## 2021-08-31 NOTE — H&P (View-Only) (Signed)
[facility] Progress Note  Patient: Jordan Adams    ZCH:[id number]  DOA: 08/27/2021    Date of Service: the patient was seen and examined on 08/31/2021  Brief hospital course: 31 year old with history of heroin abuse who presented to the emergency department on 1/21 with complaints of right-sided chest pain and CT scan of chest revealed multifocal pneumonia causing acute respiratory failure with hypoxia and acute kidney injury.  Patient found to be in sepsis.  Started on IV fluids and antibiotics and admitted to the hospitalist service.  Patient found to have staph bacteremia with collection 2D echo unrevealing.  Cardiology plans to take patient for TEE on 1/26.  Assessment and Plan: * MSSA bacteremia- (present on admission) TEE tomorrow to rule out endocarditis.  High risk given history of IV drug use.  Continue Ancef.  Multifocal pneumonia- (present on admission) Improving.  Breathing comfortably on room air.  Polysubstance abuse (HCC)- (present on admission) Counseled.  This includes IV drug use  Overweight (BMI 25.0-29.9)- (present on admission) Meets criteria BMI greater than 25  Hepatitis C antibody: Reactive.  Quantitative pending  Body mass index is 27.71 kg/m.        Consultants: Infectious disease Cardiology  Procedures: 2D echo done 1/23: Unremarkable Plan TEE 1/26  Antimicrobials: IV Ancef with end date to be determined following TEE Completed 5-day course of Rocephin/Zithromax  Code Status: Full code   Subjective: Patient with no complaints, mild cough  Objective: Vital signs were reviewed and unremarkable. Vitals:   08/31/21 0441 08/31/21 0811  BP: 132/74   Pulse: 65   Resp: 19   Temp: 98.2 F (36.8 C)   SpO2: 99% 97%    Intake/Output Summary (Last 24 hours) at 08/31/2021 1717 Last data filed at 08/31/2021 0441 Gross per 24 hour  Intake 240 ml  Output 400 ml  Net -160 ml   Filed Weights   08/27/21 0248 08/27/21 0737  Weight: 95.3 kg  95.3 kg   Body mass index is 27.71 kg/m.  Exam:  General: Alert and oriented x3, no acute distress HEENT: Normocephalic and atraumatic, mucous membranes are moist Cardiovascular: Regular rate and rhythm, S1-S2 Respiratory: Clear to auscultation bilaterally Abdomen: Soft, nontender, nondistended, positive bowel sounds Musculoskeletal: No clubbing or cyanosis or edema Skin: No skin breaks, tears or lesions Psychiatry: Appropriate, no evidence of psychoses Neurology: No focal deficits  Data Reviewed: My review of labs, imaging, notes and other tests shows no new significant findings.   Disposition:  Status is: Inpatient  Remains inpatient appropriate because: TEE    Family Communication: Declined for me to call family DVT Prophylaxis: SCDs Start: 08/27/21 1526    Author: Hollice Espy ,MD 08/31/2021 5:17 PM  To reach On-call, see care teams to locate the attending and reach out via [email or web address]. Between 7PM-7AM, please contact night-coverage If you still have difficulty reaching the attending provider, please page the [facility] (Director on Call) for [facility] on amion for assistance.

## 2021-09-01 ENCOUNTER — Encounter (HOSPITAL_COMMUNITY): Payer: Self-pay | Admitting: Internal Medicine

## 2021-09-01 ENCOUNTER — Inpatient Hospital Stay (HOSPITAL_COMMUNITY): Payer: Self-pay | Admitting: Certified Registered Nurse Anesthetist

## 2021-09-01 ENCOUNTER — Inpatient Hospital Stay (HOSPITAL_COMMUNITY): Payer: Self-pay

## 2021-09-01 ENCOUNTER — Encounter (HOSPITAL_COMMUNITY)
Admission: EM | Disposition: A | Discharge status: Home / Self Care | Payer: Self-pay | Source: Home / Self Care | Attending: Internal Medicine

## 2021-09-01 DIAGNOSIS — R768 Other specified abnormal immunological findings in serum: Secondary | ICD-10-CM

## 2021-09-01 DIAGNOSIS — J9601 Acute respiratory failure with hypoxia: Secondary | ICD-10-CM

## 2021-09-01 DIAGNOSIS — N179 Acute kidney failure, unspecified: Secondary | ICD-10-CM | POA: Diagnosis not present

## 2021-09-01 DIAGNOSIS — R7881 Bacteremia: Secondary | ICD-10-CM

## 2021-09-01 HISTORY — PX: TEE WITHOUT CARDIOVERSION: SHX5443

## 2021-09-01 LAB — BASIC METABOLIC PANEL
Anion gap: 8 (ref 5–15)
BUN: 17 mg/dL (ref 6–20)
CO2: 24 mmol/L (ref 22–32)
Calcium: 8.7 mg/dL — ABNORMAL LOW (ref 8.9–10.3)
Chloride: 103 mmol/L (ref 98–111)
Creatinine, Ser: 0.68 mg/dL (ref 0.61–1.24)
GFR, Estimated: >60 mL/min (ref >60)
Glucose, Bld: 103 mg/dL — ABNORMAL HIGH (ref 70–99)
Potassium: 4.1 mmol/L (ref 3.5–5.1)
Sodium: 135 mmol/L (ref 135–145)

## 2021-09-01 LAB — CULTURE, BLOOD (ROUTINE X 2): Culture: NO GROWTH

## 2021-09-01 LAB — LEGIONELLA PNEUMOPHILA SEROGP 1 UR AG: L. pneumophila Serogp 1 Ur Ag: NEGATIVE

## 2021-09-01 SURGERY — ECHOCARDIOGRAM, TRANSESOPHAGEAL
Anesthesia: Monitor Anesthesia Care

## 2021-09-01 MED ORDER — MENTHOL 3 MG MT LOZG
1.0000 | LOZENGE | OROMUCOSAL | Status: DC | PRN
  Administered 2021-09-01: 3 mg via ORAL
  Filled 2021-09-01: qty 9

## 2021-09-01 MED ORDER — MIDAZOLAM HCL 2 MG/2ML IJ SOLN
INTRAMUSCULAR | Status: DC | PRN
  Administered 2021-09-01: 2 mg via INTRAVENOUS

## 2021-09-01 MED ORDER — SODIUM CHLORIDE 0.9 % IV SOLN
INTRAVENOUS | Status: AC | PRN
  Administered 2021-09-01: 500 mL via INTRAVENOUS

## 2021-09-01 MED ORDER — PROPOFOL 500 MG/50ML IV EMUL
INTRAVENOUS | Status: DC | PRN
  Administered 2021-09-01: 125 ug/kg/min via INTRAVENOUS

## 2021-09-01 MED ORDER — LIDOCAINE 2% (20 MG/ML) 5 ML SYRINGE
INTRAMUSCULAR | Status: DC | PRN
  Administered 2021-09-01: 50 mg via INTRAVENOUS

## 2021-09-01 MED ORDER — MIDAZOLAM HCL 2 MG/2ML IJ SOLN
INTRAMUSCULAR | Status: AC
  Filled 2021-09-01: qty 2

## 2021-09-01 MED ORDER — SODIUM CHLORIDE 0.9 % IV SOLN: INTRAVENOUS | Status: DC

## 2021-09-01 MED ORDER — PROPOFOL 10 MG/ML IV BOLUS
INTRAVENOUS | Status: DC | PRN
  Administered 2021-09-01: 40 mg via INTRAVENOUS
  Administered 2021-09-01: 50 mg via INTRAVENOUS

## 2021-09-01 NOTE — Anesthesia Procedure Notes (Signed)
Procedure Name: MAC Date/Time: 09/01/2021 10:20 AM Performed by: Reece Agar, CRNA Pre-anesthesia Checklist: Patient identified, Emergency Drugs available, Suction available and Patient being monitored Patient Re-evaluated:Patient Re-evaluated prior to induction Oxygen Delivery Method: Nasal cannula

## 2021-09-01 NOTE — Progress Notes (Signed)
[facility] Progress Note  Patient: Jordan Adams    CZY:[id number]  DOA: 08/27/2021    Date of Service: the patient was seen and examined on 09/01/2021  Brief hospital course: 31 year old with history of heroin abuse who presented to the emergency department on 1/21 with complaints of right-sided chest pain and CT scan of chest revealed multifocal pneumonia causing acute respiratory failure with hypoxia and acute kidney injury.  Patient found to be in sepsis.  Started on IV fluids and antibiotics and admitted to the hospitalist service.  Patient found to have staph bacteremia with collection 2D echo unrevealing.  Patient underwent TEE on 1/26 which was unremarkable for any vegetations fortunately.  Assessment and Plan: * MSSA bacteremia- (present on admission) TEE negative for endocarditis.  Patient to get 1 more day of IV Ancef and then can change over to p.o. antibiotics for 10 more days.  Multifocal pneumonia- (present on admission) Improving.  Breathing comfortably on room air.  AKI (acute kidney injury) (HCC)-resolved as of 09/01/2021 Normal renal function at baseline.  Creatinine jumped to 1.58 on 1/25, possibly from Toradol.  Toradol discontinued and follow-up basic metabolic panel notes normalization of renal function.  Polysubstance abuse (HCC)- (present on admission) Counseled.  This includes IV drug use  HCV antibody positive- (present on admission) Initial quantitative level ordered inconclusive.  We ordered.  ID will follow up.  Overweight (BMI 25.0-29.9)- (present on admission) Meets criteria BMI greater than 25  Hepatitis C antibody: Reactive.  Quantitative pending  Body mass index is 27.81 kg/m.        Consultants: Infectious disease Cardiology  Procedures: 2D echo done 1/23: Unremarkable Plan TEE 1/26  Antimicrobials: IV Ancef day 6/7 Planned p.o. cefadroxil 1/27-2/6 Completed 5-day course of Rocephin/Zithromax  Code Status: Full  code   Subjective: Patient complains of sore throat following TEE.  Tired  Objective: Vital signs were reviewed and unremarkable. Vitals:   09/01/21 1100 09/01/21 1317  BP: 110/61 129/76  Pulse: (!) 54 76  Resp: 17 18  Temp:  98.5 F (36.9 C)  SpO2: 97% 98%    Intake/Output Summary (Last 24 hours) at 09/01/2021 1451 Last data filed at 09/01/2021 1037 Gross per 24 hour  Intake 300 ml  Output 250 ml  Net 50 ml    Filed Weights   08/27/21 0248 08/27/21 0737 09/01/21 0940  Weight: 95.3 kg 95.3 kg 95.6 kg   Body mass index is 27.81 kg/m.  Exam:  General: Alert and oriented x3, fatigued HEENT: Normocephalic and atraumatic, mucous membranes are moist Cardiovascular: Regular rate and rhythm, S1-S2 Respiratory: Clear to auscultation bilaterally Abdomen: Soft, nontender, nondistended, positive bowel sounds Musculoskeletal: No clubbing or cyanosis or edema Skin: No skin breaks, tears or lesions Psychiatry: Appropriate, no evidence of psychoses Neurology: No focal deficits  Data Reviewed: My review of labs, imaging, notes and other tests shows no new significant findings.   Disposition:  Status is: Inpatient  Remains inpatient appropriate because: Need for continued IV antibiotics which ends tomorrow    Family Communication: Declined for me to call family DVT Prophylaxis: SCDs Start: 08/27/21 1526    Author: Hollice Espy ,MD 09/01/2021 2:51 PM  To reach On-call, see care teams to locate the attending and reach out via [email or web address]. Between 7PM-7AM, please contact night-coverage If you still have difficulty reaching the attending provider, please page the [facility] (Director on Call) for [facility] on amion for assistance.

## 2021-09-01 NOTE — Interval H&P Note (Signed)
History and Physical Interval Note:  09/01/2021 9:57 AM  Jordan Adams  has presented today for surgery, with the diagnosis of BACTEEMIA,IV DRUG USE.  The various methods of treatment have been discussed with the patient and family. After consideration of risks, benefits and other options for treatment, the patient has consented to  Procedure(s): TRANSESOPHAGEAL ECHOCARDIOGRAM (TEE) (N/A) as a surgical intervention.  The patient's history has been reviewed, patient examined, no change in status, stable for surgery.  I have reviewed the patient's chart and labs.  Questions were answered to the patient's satisfaction.     Olga Millers

## 2021-09-01 NOTE — Anesthesia Preprocedure Evaluation (Addendum)
Anesthesia Evaluation  Patient identified by MRN, date of birth, ID band Patient awake    Reviewed: Allergy & Precautions, NPO status , Patient's Chart, lab work & pertinent test results  Airway Mallampati: I  TM Distance: >3 FB Neck ROM: Full    Dental no notable dental hx. (+) Dental Advisory Given, Teeth Intact   Pulmonary pneumonia,    Pulmonary exam normal breath sounds clear to auscultation       Cardiovascular Normal cardiovascular exam Rhythm:Regular Rate:Normal  Echo 08/29/2021 1. Left ventricular ejection fraction, by estimation, is 60 to 65%. The left ventricle has normal function. The left ventricle has no regional wall motion abnormalities. Left ventricular diastolic parameters were normal.  2. Right ventricular systolic function is normal. The right ventricular size is normal.  3. The mitral valve is normal in structure. No evidence of mitral valve regurgitation. No evidence of mitral stenosis.  4. The aortic valve is normal in structure. Aortic valve regurgitation is not visualized. No aortic stenosis is present.  5. The inferior vena cava is normal in size with greater than 50% respiratory variability, suggesting right atrial pressure of 3 mmHg.    Neuro/Psych Seizures -,     GI/Hepatic negative GI ROS, Neg liver ROS,   Endo/Other  negative endocrine ROS  Renal/GU Renal disease     Musculoskeletal negative musculoskeletal ROS (+)   Abdominal   Peds  Hematology negative hematology ROS (+)   Anesthesia Other Findings   Reproductive/Obstetrics                            Anesthesia Physical Anesthesia Plan  ASA: 2  Anesthesia Plan: MAC   Post-op Pain Management: Minimal or no pain anticipated   Induction: Intravenous  PONV Risk Score and Plan: 1 and Treatment may vary due to age or medical condition, Propofol infusion and TIVA  Airway Management Planned: Natural  Airway  Additional Equipment:   Intra-op Plan:   Post-operative Plan:   Informed Consent: I have reviewed the patients History and Physical, chart, labs and discussed the procedure including the risks, benefits and alternatives for the proposed anesthesia with the patient or authorized representative who has indicated his/her understanding and acceptance.     Dental advisory given  Plan Discussed with: CRNA  Anesthesia Plan Comments:        Anesthesia Quick Evaluation

## 2021-09-01 NOTE — Progress Notes (Signed)
° ° °  Transesophageal Echocardiogram Note  Tirth Cothron [id number] Nov 10, 1990  Procedure: Transesophageal Echocardiogram Indications: Bacteremia  Procedure Details Consent: Obtained Time Out: Verified patient identification, verified procedure, site/side was marked, verified correct patient position, special equipment/implants available, Radiology Safety Procedures followed,  medications/allergies/relevent history reviewed, required imaging and test results available.  Performed  Medications:  Pt sedated by anesthesia with diprovan 274 mg IV total.   Normal LV function; redundant MV chord noted; no vegetations.   Complications: No apparent complications Patient did tolerate procedure well.  Olga Millers, MD

## 2021-09-01 NOTE — Progress Notes (Signed)
[facility] for Infectious Disease   Reason for visit: Follow up on bacteremia  Interval History: TEE negative for any vegetation; remains afebrile Day 6 total antibiotics  Physical Exam: Constitutional:  Vitals:   09/01/21 1100 09/01/21 1317  BP: 110/61 129/76  Pulse: (!) 54 76  Resp: 17 18  Temp:  98.5 F (36.9 C)  SpO2: 97% 98%   patient appears in NAD Respiratory: Normal respiratory effort; CTA B Cardiovascular: RRR   Review of Systems: Constitutional: negative for fevers and chills  Lab Results  Component Value Date   WBC 9.4 08/31/2021   HGB 13.5 08/31/2021   HCT 39.8 08/31/2021   MCV 92.1 08/31/2021   PLT 139 (L) 08/31/2021    Lab Results  Component Value Date   CREATININE 0.68 09/01/2021   BUN 17 09/01/2021   NA 135 09/01/2021   K 4.1 09/01/2021   CL 103 09/01/2021   CO2 24 09/01/2021    Lab Results  Component Value Date   ALT 19 08/31/2021   AST 18 08/31/2021   ALKPHOS 58 08/31/2021     Microbiology: Recent Results (from the past 240 hour(s))  Resp Panel by RT-PCR (Flu A&B, Covid) Nasopharyngeal Swab     Status: None   Collection Time: 08/27/21  4:15 AM   Specimen: Nasopharyngeal Swab; Nasopharyngeal(NP) swabs in vial transport medium  Result Value Ref Range Status   SARS Coronavirus 2 by RT PCR NEGATIVE NEGATIVE Final    Comment: (NOTE) SARS-CoV-2 target nucleic acids are NOT DETECTED.  The SARS-CoV-2 RNA is generally detectable in upper respiratory specimens during the acute phase of infection. The lowest concentration of SARS-CoV-2 viral copies this assay can detect is 138 copies/mL. A negative result does not preclude SARS-Cov-2 infection and should not be used as the sole basis for treatment or other patient management decisions. A negative result may occur with  improper specimen collection/handling, submission of specimen other than nasopharyngeal swab, presence of viral mutation(s) within the areas targeted by this assay,  and inadequate number of viral copies(<138 copies/mL). A negative result must be combined with clinical observations, patient history, and epidemiological information. The expected result is Negative.  Fact Sheet for Patients:  EntrepreneurPulse.com.au  Fact Sheet for Healthcare Providers:  IncredibleEmployment.be  This test is no t yet approved or cleared by the [place] FDA and  has been authorized for detection and/or diagnosis of SARS-CoV-2 by FDA under an Emergency Use Authorization (EUA). This EUA will remain  in effect (meaning this test can be used) for the duration of the COVID-19 declaration under Section 564(b)(1) of the Act, 21 U.S.C.section 360bbb-3(b)(1), unless the authorization is terminated  or revoked sooner.       Influenza A by PCR NEGATIVE NEGATIVE Final   Influenza B by PCR NEGATIVE NEGATIVE Final    Comment: (NOTE) The Xpert Xpress SARS-CoV-2/FLU/RSV plus assay is intended as an aid in the diagnosis of influenza from Nasopharyngeal swab specimens and should not be used as a sole basis for treatment. Nasal washings and aspirates are unacceptable for Xpert Xpress SARS-CoV-2/FLU/RSV testing.  Fact Sheet for Patients: EntrepreneurPulse.com.au  Fact Sheet for Healthcare Providers: IncredibleEmployment.be  This test is not yet approved or cleared by the [place] FDA and has been authorized for detection and/or diagnosis of SARS-CoV-2 by FDA under an Emergency Use Authorization (EUA). This EUA will remain in effect (meaning this test can be used) for the duration of the COVID-19 declaration under Section 564(b)(1) of the Act, 21  U.S.C. section 360bbb-3(b)(1), unless the authorization is terminated or revoked.  Performed at [facility], [place] [address], [place], [place] [address]   Blood Culture (routine x 2)     Status: None   Collection Time: 08/27/21   8:58 AM   Specimen: BLOOD  Result Value Ref Range Status   Specimen Description   Final    BLOOD BLOOD LEFT FOREARM Performed at [facility] [address], [place], [place] [address]    Special Requests   Final    BOTTLES DRAWN AEROBIC AND ANAEROBIC Blood Culture results may not be optimal due to an inadequate volume of blood received in culture bottles Performed at [facility] [address], [place], [place] [address]    Culture   Final    NO GROWTH 5 DAYS Performed at [facility], [place] [address] [place], [place] [address]    Report Status 09/01/2021 FINAL  Final  Blood Culture (routine x 2)     Status: Abnormal   Collection Time: 08/27/21  9:19 AM   Specimen: BLOOD  Result Value Ref Range Status   Specimen Description   Final    BLOOD BLOOD RIGHT HAND Performed at [facility] [address], [place], [place] [address]    Special Requests   Final    BOTTLES DRAWN AEROBIC AND ANAEROBIC Blood Culture results may not be optimal due to an inadequate volume of blood received in culture bottles Performed at [facility] [address] [place], [place] [address]    Culture  Setup Time   Final    GRAM POSITIVE COCCI AEROBIC BOTTLE ONLY CRITICAL RESULT CALLED TO, READ BACK BY AND VERIFIED WITH: M LILLISTON,PHARMD@[id number]  08/28/21 [place]    Culture (A)  Final    STAPHYLOCOCCUS AUREUS STAPHYLOCOCCUS EPIDERMIDIS THE SIGNIFICANCE OF ISOLATING THIS ORGANISM FROM A SINGLE SET OF BLOOD CULTURES WHEN MULTIPLE SETS ARE DRAWN IS UNCERTAIN. PLEASE NOTIFY THE MICROBIOLOGY DEPARTMENT WITHIN ONE WEEK IF SPECIATION AND SENSITIVITIES ARE REQUIRED. Performed at [facility], [place] [address] [place], [place] [address]    Report Status 08/31/2021 FINAL  Final   Organism ID, Bacteria STAPHYLOCOCCUS AUREUS  Final      Susceptibility   Staphylococcus aureus - MIC*    CIPROFLOXACIN <=0.5 SENSITIVE Sensitive      ERYTHROMYCIN <=0.25 SENSITIVE Sensitive     GENTAMICIN <=0.5 SENSITIVE Sensitive     OXACILLIN <=0.25 SENSITIVE Sensitive     TETRACYCLINE <=1 SENSITIVE Sensitive     VANCOMYCIN <=0.5 SENSITIVE Sensitive     TRIMETH/SULFA <=10 SENSITIVE Sensitive     CLINDAMYCIN <=0.25 SENSITIVE Sensitive     RIFAMPIN <=0.5 SENSITIVE Sensitive     Inducible Clindamycin NEGATIVE Sensitive     * STAPHYLOCOCCUS AUREUS  Blood Culture ID Panel (Reflexed)     Status: Abnormal   Collection Time: 08/27/21  9:19 AM  Result Value Ref Range Status   Enterococcus faecalis NOT DETECTED NOT DETECTED Final   Enterococcus Faecium NOT DETECTED NOT DETECTED Final   Listeria monocytogenes NOT DETECTED NOT DETECTED Final   Staphylococcus species DETECTED (A) NOT DETECTED Final    Comment: CRITICAL RESULT CALLED TO, READ BACK BY AND VERIFIED WITH: M LILLISTON,PHARMD@[id number]  08/28/21 [place]    Staphylococcus aureus (BCID) DETECTED (A) NOT DETECTED Final    Comment: Methicillin (oxacillin) susceptible Staphylococcus aureus (MSSA). Preferred therapy is anti staphylococcal beta lactam antibiotic (Cefazolin or Nafcillin), unless clinically contraindicated. CRITICAL RESULT CALLED TO,  READ BACK BY AND VERIFIED WITH: M LILLISTON,PHARMD@[email or web address]  08/28/21 [place]    Staphylococcus epidermidis DETECTED (A) NOT DETECTED Final    Comment: CRITICAL RESULT CALLED TO, READ BACK BY AND VERIFIED WITH: M LILLISTON,PHARMD@[email or web address]  08/28/21 [place]    Staphylococcus lugdunensis NOT DETECTED NOT DETECTED Final   Streptococcus species NOT DETECTED NOT DETECTED Final   Streptococcus agalactiae NOT DETECTED NOT DETECTED Final   Streptococcus pneumoniae NOT DETECTED NOT DETECTED Final   Streptococcus pyogenes NOT DETECTED NOT DETECTED Final   A.calcoaceticus-baumannii NOT DETECTED NOT DETECTED Final   Bacteroides fragilis NOT DETECTED NOT DETECTED Final   Enterobacterales NOT DETECTED NOT DETECTED Final   Enterobacter cloacae complex NOT DETECTED NOT DETECTED Final    Escherichia coli NOT DETECTED NOT DETECTED Final   Klebsiella aerogenes NOT DETECTED NOT DETECTED Final   Klebsiella oxytoca NOT DETECTED NOT DETECTED Final   Klebsiella pneumoniae NOT DETECTED NOT DETECTED Final   Proteus species NOT DETECTED NOT DETECTED Final   Salmonella species NOT DETECTED NOT DETECTED Final   Serratia marcescens NOT DETECTED NOT DETECTED Final   Haemophilus influenzae NOT DETECTED NOT DETECTED Final   Neisseria meningitidis NOT DETECTED NOT DETECTED Final   Pseudomonas aeruginosa NOT DETECTED NOT DETECTED Final   Stenotrophomonas maltophilia NOT DETECTED NOT DETECTED Final   Candida albicans NOT DETECTED NOT DETECTED Final   Candida auris NOT DETECTED NOT DETECTED Final   Candida glabrata NOT DETECTED NOT DETECTED Final   Candida krusei NOT DETECTED NOT DETECTED Final   Candida parapsilosis NOT DETECTED NOT DETECTED Final   Candida tropicalis NOT DETECTED NOT DETECTED Final   Cryptococcus neoformans/gattii NOT DETECTED NOT DETECTED Final   Methicillin resistance mecA/C NOT DETECTED NOT DETECTED Final   Meth resistant mecA/C and MREJ NOT DETECTED NOT DETECTED Final    Comment: Performed at [facility], [address] [place], [place] [address]  Culture, blood (routine x 2)     Status: None (Preliminary result)   Collection Time: 08/29/21  8:08 AM   Specimen: BLOOD  Result Value Ref Range Status   Specimen Description   Final    BLOOD LEFT ANTECUBITAL Performed at [facility] [address] [place], [place] [address]    Special Requests   Final    BOTTLES DRAWN AEROBIC AND ANAEROBIC Blood Culture adequate volume Performed at [facility] [address] [place], [place] [address]    Culture   Final    NO GROWTH 3 DAYS Performed at [facility], [place] [address], [place], [place] [address]    Report Status PENDING  Incomplete  Culture, blood (routine x 2)     Status: None (Preliminary result)    Collection Time: 08/29/21  8:18 AM   Specimen: BLOOD  Result Value Ref Range Status   Specimen Description   Final    BLOOD RIGHT ANTECUBITAL Performed at [facility] [address], [place], [place] [address]    Special Requests   Final    BOTTLES DRAWN AEROBIC AND ANAEROBIC Blood Culture adequate volume Performed at [facility] [address] [place], [place] [address]    Culture   Final    NO GROWTH 3 DAYS Performed at [facility], [place] [address], [place], [place] [address]    Report Status PENDING  Incomplete    Impression/Plan:  1. MSSA bacteremia - 1/4 bottles initially positive with Staph aureus and Staph epidermidis.  Repeat blood cultures no growth now  at three days.  TEE negative for vegetation.   At this point, clinically he is doing well and no new concerns and has completed 6 days of IV treatment.  OK from ID standpoint for him to continue cefazolin for 1 more day through tomorrow and ok to discharge tomorrow after the am dose and he can continue with cefadroxil 1 gram twice a day for another 10 days  2.  Hepatitis C Ab positive - unable to get an HCV quant and I have reordered for tomorrow.  Will follow up with this as an outpatient and can treat him if positive.   3. Thrombocytopenia - overall has increased but remains a bit low.  No cirrhosis noted on his abdominal ultrasound.  May be related to bacteremia and can monitor as an outpatient.    4.  Polysubstance abuse - counseled   Follow up with me 09/27/21

## 2021-09-01 NOTE — Assessment & Plan Note (Addendum)
Normal renal function at baseline.  Creatinine jumped to 1.58 on 1/25, possibly from Toradol.  Toradol discontinued and follow-up basic metabolic panel notes normalization of renal function.

## 2021-09-01 NOTE — Transfer of Care (Signed)
Immediate Anesthesia Transfer of Care Note  Patient: Jordan Adams  Procedure(s) Performed: TRANSESOPHAGEAL ECHOCARDIOGRAM (TEE)  Patient Location: Endoscopy Unit  Anesthesia Type:MAC  Level of Consciousness: drowsy  Airway & Oxygen Therapy: Patient Spontanous Breathing and Patient connected to nasal cannula oxygen  Post-op Assessment: Report given to RN and Post -op Vital signs reviewed and stable  Post vital signs: Reviewed and stable  Last Vitals:  Vitals Value Taken Time  BP 104/44 09/01/21 1041  Temp    Pulse 72 09/01/21 1042  Resp 21 09/01/21 1042  SpO2 95 % 09/01/21 1042  Vitals shown include unvalidated device data.  Last Pain:  Vitals:   09/01/21 1039  TempSrc:   PainSc: Asleep      Patients Stated Pain Goal: 2 (49/17/91 5056)  Complications: No notable events documented.

## 2021-09-01 NOTE — Progress Notes (Signed)
°  Echocardiogram Echocardiogram Transesophageal has been performed.  Gerda Diss 09/01/2021, 11:10 AM

## 2021-09-01 NOTE — Plan of Care (Signed)

## 2021-09-01 NOTE — Anesthesia Postprocedure Evaluation (Signed)
Anesthesia Post Note  Patient: Jordan Adams  Procedure(s) Performed: TRANSESOPHAGEAL ECHOCARDIOGRAM (TEE)     Patient location during evaluation: PACU Anesthesia Type: MAC Level of consciousness: awake and alert Pain management: pain level controlled Vital Signs Assessment: post-procedure vital signs reviewed and stable Respiratory status: spontaneous breathing Cardiovascular status: stable Anesthetic complications: no   No notable events documented.  Last Vitals:  Vitals:   09/01/21 1100 09/01/21 1317  BP: 110/61 129/76  Pulse: (!) 54 76  Resp: 17 18  Temp:  36.9 C  SpO2: 97% 98%    Last Pain:  Vitals:   09/01/21 1323  TempSrc:   PainSc: [place]

## 2021-09-02 ENCOUNTER — Other Ambulatory Visit (HOSPITAL_COMMUNITY): Payer: Self-pay

## 2021-09-02 ENCOUNTER — Encounter (HOSPITAL_COMMUNITY): Payer: Self-pay | Admitting: Cardiology

## 2021-09-02 MED ORDER — CEFADROXIL 500 MG PO CAPS
1000.0000 mg | ORAL_CAPSULE | Freq: Two times a day (BID) | ORAL | 0 refills | Status: AC
  Filled 2021-09-02: qty 20, 5d supply, fill #0

## 2021-09-02 MED ORDER — HYDROMORPHONE HCL 2 MG PO TABS: 1.0000 mg | ORAL_TABLET | Freq: Two times a day (BID) | ORAL | 0 refills | Status: AC | PRN

## 2021-09-02 MED ORDER — CEFADROXIL 1 G PO TABS: 1.0000 g | ORAL_TABLET | Freq: Two times a day (BID) | ORAL | 0 refills | Status: DC

## 2021-09-02 NOTE — Plan of Care (Signed)
°  Problem: Education: Goal: Knowledge of General Education information will improve Description: Including pain rating scale, medication(s)/side effects and non-pharmacologic comfort measures Outcome: Progressing   Problem: Health Behavior/Discharge Planning: Goal: Ability to manage health-related needs will improve Outcome: Progressing   Problem: Nutrition: Goal: Adequate nutrition will be maintained Outcome: Progressing   Problem: Activity: Goal: Risk for activity intolerance will decrease Outcome: Adequate for Discharge

## 2021-09-02 NOTE — Discharge Summary (Signed)
Physician Discharge Summary   Patient: Jordan Adams MRN: [id number] DOB: 27-Sep-1990  Admit date:     08/27/2021  Discharge date: 09/02/21  Discharge Physician: Hollice Espy   PCP: Patient, No Pcp Per (Inactive)   Recommendations at discharge:   New medication: Cefadroxil 1 g p.o. twice daily x10 days New medication: Dilaudid 1 mg as needed every 12 hours x 4 doses  Discharge Diagnoses Principal Problem:   MSSA bacteremia Active Problems:   Polysubstance abuse (HCC)   HCV antibody positive   Overweight (BMI 25.0-29.9)  Resolved Problems:   Multifocal pneumonia   AKI (acute kidney injury) [facility])   Hospital Course   31 year old with history of heroin abuse who presented to the emergency department on 1/21 with complaints of right-sided chest pain and CT scan of chest revealed multifocal pneumonia causing acute respiratory failure with hypoxia and acute kidney injury.  Patient found to be in sepsis.  Started on IV fluids and antibiotics and admitted to the hospitalist service.  Patient found to have staph bacteremia with collection 2D echo unrevealing.  Patient underwent TEE on 1/26 which was unremarkable for any vegetations fortunately.  By 1/27, patient had completed 7-day course of IV antibiotics.  He will be discharged home with 10 more days of p.o. antibiotics.  * MSSA bacteremia- (present on admission) TEE negative for endocarditis.  Patient completed 7 days of IV Ancef.  He will be discharged on 10 more days of p.o. cefadroxil 1 g twice daily.  Multifocal pneumonia-resolved as of 09/02/2021, (present on admission) Improving.  Breathing comfortably on room air.  AKI (acute kidney injury) (HCC)-resolved as of 09/01/2021 Normal renal function at baseline.  Creatinine jumped to 1.58 on 1/25, possibly from Toradol.  Toradol discontinued and follow-up basic metabolic panel notes normalization of renal function.  Polysubstance abuse (HCC)- (present on admission) Counseled.  This  includes IV drug use  HCV antibody positive- (present on admission) Initial quantitative level ordered inconclusive.  Re-ordered.  ID will follow up.  Overweight (BMI 25.0-29.9)- (present on admission) Meets criteria BMI greater than 25       Consultants: Infectious disease Procedures performed:  -2D echo done 1/23: Unremarkable -TEE done 1/26: No evidence of vegetation Disposition: Home Diet recommendation: Regular diet  DISCHARGE MEDICATION: Allergies as of 09/02/2021   No Active Allergies      Medication List     TAKE these medications    acetaminophen 500 MG tablet Commonly known as: TYLENOL Take 1,000 mg by mouth every 6 (six) hours as needed for moderate pain.   cefadroxil 500 MG capsule Commonly known as: DURICEF Take 2 capsules (1,000 mg total) by mouth 2 (two) times daily.   HYDROmorphone 2 MG tablet Commonly known as: Dilaudid Take 0.5 tablets (1 mg total) by mouth every 12 (twelve) hours as needed for up to 5 days for severe pain.   ibuprofen 800 MG tablet Commonly known as: ADVIL Take 800 mg by mouth every 8 (eight) hours as needed for moderate pain.        Follow-up Information     [facility] Follow up.   Why: You made selected one of these to call for an appointment for hospital follow up. Contact information: [address] [place] [address] [phone]        [facility] Follow up.   Specialty: Internal Medicine Why: You made selected one of these to call for an appointment for hospital follow up  Contact information: [address] [place] [address] [phone]        [facility] Follow up.   Why: You made selected one of these to call for an appointment for hospital follow up Contact information: [address] [place] [address]                Discharge Exam: Ceasar Mons Weights    08/27/21 0248 08/27/21 0737 09/01/21 0940  Weight: 95.3 kg 95.3 kg 95.6 kg   General: Alert and oriented x3, no acute distress Cardiovascular: Regular rate and rhythm, S1-S2 Lungs: Clear to auscultation bilaterally  Condition at discharge: good  The results of significant diagnostics from this hospitalization (including imaging, microbiology, ancillary and laboratory) are listed below for reference.   Imaging Studies: DG Ribs Unilateral W/Chest Right  Result Date: 08/27/2021 CLINICAL DATA:  Anxiety and fall. EXAM: RIGHT RIBS AND CHEST - 3+ VIEW COMPARISON:  March 05, 2021 FINDINGS: A radiopaque marker was placed at the site of the patient's pain. No fracture or other bone lesions are seen involving the ribs. There is no evidence of pneumothorax or pleural effusion. Moderate severity patchy left perihilar and bibasilar infiltrates are seen. Heart size and mediastinal contours are within normal limits. IMPRESSION: 1. Moderate severity bilateral patchy infiltrates. Given the patient's history of recent trauma, sequelae associated with pulmonary contusions cannot be excluded. 2. No acute osseous abnormality. Electronically Signed   By: Aram Candela M.D.   On: 08/27/2021 03:41   CT Chest W Contrast  Result Date: 08/27/2021 CLINICAL DATA:  Blunt chest trauma. Fell from roof 2 days ago. Shortness of breath and chest pain. EXAM: CT CHEST WITH CONTRAST TECHNIQUE: Multidetector CT imaging of the chest was performed during intravenous contrast administration. RADIATION DOSE REDUCTION: This exam was performed according to the departmental dose-optimization program which includes automated exposure control, adjustment of the mA and/or kV according to patient size and/or use of iterative reconstruction technique. CONTRAST:  21mL OMNIPAQUE IOHEXOL 300 MG/ML  SOLN COMPARISON:  03/04/2021 FINDINGS: Cardiovascular: No significant vascular findings. Normal heart size. No pericardial effusion.  Mediastinum/Nodes: No enlarged mediastinal, hilar, or axillary lymph nodes. Thyroid gland, trachea, and esophagus demonstrate no significant findings. Lungs/Pleura: No pleural effusion. No pneumothorax or atelectasis. Extensive multifocal patchy airspace densities are identified throughout the left upper lobe, left lower lobe and to a lesser extent right lower lobe and right middle lobe. A few patchy airspace densities are also noted within the basilar right upper lobe. Imaging findings are concerning for multifocal infection. Upper Abdomen: No acute abnormality Musculoskeletal: The thoracic vertebral body heights are well maintained without signs of fracture. Sternum is intact. Remote healed left lateral rib fracture, image 105/5. No acute fracture identified. IMPRESSION: 1. No acute post-traumatic findings abnormalities. 2. Extensive multifocal, bilateral airspace densities are identified, left greater than right. Imaging findings are compatible with multifocal infection. 3. Remote healed left lateral rib fracture. Electronically Signed   By: Signa Kell M.D.   On: 08/27/2021 07:40   US Abdomen Complete  Result Date: 08/27/2021 CLINICAL DATA:  Acute kidney injury, elevated LFT EXAM: ABDOMEN ULTRASOUND COMPLETE COMPARISON:  CT 03/04/2021 FINDINGS: Gallbladder: No shadowing stone. Small amount of gallbladder sludge. Normal wall thickness. Negative sonographic Murphy. Common bile duct: Diameter: 3 mm Liver: No focal lesion identified. Within normal limits in parenchymal echogenicity. Portal vein is patent on color Doppler imaging with normal direction of blood flow towards the liver. IVC: No abnormality visualized. Pancreas:  Not visualized due to bowel gas Spleen: Size and appearance within normal limits. Right Kidney: Length: 11 cm. Echogenicity within normal limits. No mass or hydronephrosis visualized. Left Kidney: Length: 11 cm. Echogenicity within normal limits. No mass or hydronephrosis visualized.  Abdominal aorta: No aneurysm visualized. Other findings: None. IMPRESSION: 1. Small amount of gallbladder sludge. Negative for sonographic features to suggest acute gallbladder disease. 2. Nonvisualized pancreas due to bowel gas 3. Otherwise negative examination Electronically Signed   By: Jasmine Pang M.D.   On: 08/27/2021 17:09   DG Hand Complete Left  Result Date: 08/27/2021 CLINICAL DATA:  Status post fall. EXAM: LEFT HAND - COMPLETE 3+ VIEW COMPARISON:  August 23, 2020 FINDINGS: There is no evidence of an acute fracture or dislocation. A chronic fracture deformity of the fifth left metacarpal is noted. There is no evidence of arthropathy or other focal bone abnormality. Soft tissues are unremarkable. IMPRESSION: No acute fracture or dislocation. Electronically Signed   By: Aram Candela M.D.   On: 08/27/2021 03:42   ECHOCARDIOGRAM COMPLETE  Result Date: 08/29/2021    ECHOCARDIOGRAM REPORT   Patient Name:   Jordan Adams Date of Exam: 08/29/2021 Medical Rec #:  [id number]    Height:       73.0 in Accession #:    [id number]   Weight:       210.0 lb Date of Birth:  1990/11/06    BSA:          2.197 m Patient Age:    30 years     BP:           118/63 mmHg Patient Gender: M            HR:           60 bpm. Exam Location:  Inpatient Procedure: 2D Echo Indications:    Bacteremia  History:        Patient has no prior history of Echocardiogram examinations.  Sonographer:    Eduard Roux Referring Phys: [id number] TRUNG T VU IMPRESSIONS  1. Left ventricular ejection fraction, by estimation, is 60 to 65%. The left ventricle has normal function. The left ventricle has no regional wall motion abnormalities. Left ventricular diastolic parameters were normal.  2. Right ventricular systolic function is normal. The right ventricular size is normal.  3. The mitral valve is normal in structure. No evidence of mitral valve regurgitation. No evidence of mitral stenosis.  4. The aortic valve is normal in structure.  Aortic valve regurgitation is not visualized. No aortic stenosis is present.  5. The inferior vena cava is normal in size with greater than 50% respiratory variability, suggesting right atrial pressure of 3 mmHg. FINDINGS  Left Ventricle: Left ventricular ejection fraction, by estimation, is 60 to 65%. The left ventricle has normal function. The left ventricle has no regional wall motion abnormalities. The left ventricular internal cavity size was normal in size. There is  no left ventricular hypertrophy. Left ventricular diastolic parameters were normal. Right Ventricle: The right ventricular size is normal. No increase in right ventricular wall thickness. Right ventricular systolic function is normal. Left Atrium: Left atrial size was normal in size. Right Atrium: Right atrial size was normal in size. Pericardium: There is no evidence of pericardial effusion. Mitral Valve: The mitral valve is normal in structure. No evidence of mitral valve regurgitation. No evidence of mitral valve stenosis. Tricuspid Valve: The tricuspid valve is normal in structure. Tricuspid valve regurgitation is trivial. No evidence of tricuspid stenosis. Aortic  Valve: The aortic valve is normal in structure. Aortic valve regurgitation is not visualized. No aortic stenosis is present. Aortic valve peak gradient measures 5.7 mmHg. Pulmonic Valve: The pulmonic valve was normal in structure. Pulmonic valve regurgitation is trivial. No evidence of pulmonic stenosis. Aorta: The aortic root is normal in size and structure. Venous: The inferior vena cava is normal in size with greater than 50% respiratory variability, suggesting right atrial pressure of 3 mmHg. IAS/Shunts: No atrial level shunt detected by color flow Doppler.  LEFT VENTRICLE PLAX 2D LVIDd:         4.80 cm Diastology LVIDs:         3.50 cm LV e' medial:    10.30 cm/s LV PW:         1.00 cm LV E/e' medial:  7.9 LV IVS:        1.00 cm LV e' lateral:   13.10 cm/s                         LV E/e' lateral: 6.2  RIGHT VENTRICLE             IVC RV Basal diam:  3.00 cm     IVC diam: 2.20 cm RV S prime:     14.40 cm/s TAPSE (M-mode): 2.6 cm LEFT ATRIUM             Index        RIGHT ATRIUM           Index LA diam:        3.80 cm 1.73 cm/m   RA Area:     17.30 cm LA Vol (A2C):   44.8 ml 20.39 ml/m  RA Volume:   53.60 ml  24.40 ml/m LA Vol (A4C):   45.5 ml 20.71 ml/m LA Biplane Vol: 48.5 ml 22.08 ml/m  AORTIC VALVE              PULMONIC VALVE AV Vmax:      119.50 cm/s PV Vmax:       0.96 m/s AV Peak Grad: 5.7 mmHg    PV Peak grad:  3.7 mmHg LVOT Vmax:    104.00 cm/s LVOT Vmean:   60.500 cm/s LVOT VTI:     0.209 m  AORTA Ao Root diam: 3.70 cm Ao Asc diam:  3.30 cm MITRAL VALVE MV Area (PHT): 2.81 cm    SHUNTS MV Decel Time: 270 msec    Systemic VTI: 0.21 m MV E velocity: 81.00 cm/s MV A velocity: 66.50 cm/s MV E/A ratio:  1.22 Charlton HawsPeter Nishan MD Electronically signed by Charlton HawsPeter Nishan MD Signature Date/Time: 08/29/2021/1:04:58 PM    Final    ECHO TEE  Result Date: 09/01/2021    TRANSESOPHOGEAL ECHO REPORT   Patient Name:   Jordan Adams Date of Exam: 09/01/2021 Medical Rec #:  [id number]    Height:       73.0 in Accession #:    [id number]   Weight:       210.8 lb Date of Birth:  08-Jul-1991    BSA:          2.200 m Patient Age:    30 years     BP:           110/61 mmHg Patient Gender: M            HR:           72 bpm. Exam Location:  Inpatient Procedure: Transesophageal  Echo, Cardiac Doppler and Color Doppler Indications:     Bacteremia  History:         Patient has prior history of Echocardiogram examinations, most                  recent 08/29/2021. Polysubstance abuse. MSSA bacteremia.  Sonographer:     Ross Ludwig RDCS (AE) Referring Phys:  [id number] Tacey Ruiz DUNN Diagnosing Phys: Olga Millers MD PROCEDURE: After discussion of the risks and benefits of a TEE, an informed consent was obtained from the patient. The transesophogeal probe was passed without difficulty through the esophogus of the patient.  Sedation performed by different physician. The patient was monitored while under deep sedation. Anesthestetic sedation was provided intravenously by Anesthesiology: 274.03mg  of Propofol, 50mg  of Lidocaine. The patient developed no complications during the procedure. IMPRESSIONS  1. Redundant MV chord noted but no vegetations.  2. Left ventricular ejection fraction, by estimation, is 55 to 60%. The left ventricle has normal function. The left ventricle has no regional wall motion abnormalities.  3. Right ventricular systolic function is normal. The right ventricular size is normal.  4. No left atrial/left atrial appendage thrombus was detected.  5. The mitral valve is normal in structure. Trivial mitral valve regurgitation.  6. The aortic valve is normal in structure. Aortic valve regurgitation is not visualized. FINDINGS  Left Ventricle: Left ventricular ejection fraction, by estimation, is 55 to 60%. The left ventricle has normal function. The left ventricle has no regional wall motion abnormalities. The left ventricular internal cavity size was normal in size. Right Ventricle: The right ventricular size is normal. Right ventricular systolic function is normal. Left Atrium: Left atrial size was normal in size. No left atrial/left atrial appendage thrombus was detected. Right Atrium: Right atrial size was normal in size. Pericardium: There is no evidence of pericardial effusion. Mitral Valve: The mitral valve is normal in structure. Trivial mitral valve regurgitation. Tricuspid Valve: The tricuspid valve is normal in structure. Tricuspid valve regurgitation is trivial. Aortic Valve: The aortic valve is normal in structure. Aortic valve regurgitation is not visualized. Pulmonic Valve: The pulmonic valve was normal in structure. Pulmonic valve regurgitation is not visualized. Aorta: The aortic root is normal in size and structure. There is minimal (Grade I) plaque involving the descending aorta. IAS/Shunts: No atrial  level shunt detected by color flow Doppler. Additional Comments: Redundant MV chord noted but no vegetations. Olga Millers MD Electronically signed by Olga Millers MD Signature Date/Time: 09/01/2021/12:33:46 PM    Final     Microbiology: Results for orders placed or performed during the hospital encounter of 08/27/21  Resp Panel by RT-PCR (Flu A&B, Covid) Nasopharyngeal Swab     Status: None   Collection Time: 08/27/21  4:15 AM   Specimen: Nasopharyngeal Swab; Nasopharyngeal(NP) swabs in vial transport medium  Result Value Ref Range Status   SARS Coronavirus 2 by RT PCR NEGATIVE NEGATIVE Final    Comment: (NOTE) SARS-CoV-2 target nucleic acids are NOT DETECTED.  The SARS-CoV-2 RNA is generally detectable in upper respiratory specimens during the acute phase of infection. The lowest concentration of SARS-CoV-2 viral copies this assay can detect is 138 copies/mL. A negative result does not preclude SARS-Cov-2 infection and should not be used as the sole basis for treatment or other patient management decisions. A negative result may occur with  improper specimen collection/handling, submission of specimen other than nasopharyngeal swab, presence of viral mutation(s) within the areas targeted by this assay, and inadequate number of  viral copies(<138 copies/mL). A negative result must be combined with clinical observations, patient history, and epidemiological information. The expected result is Negative.  Fact Sheet for Patients:  BloggerCourse.com  Fact Sheet for Healthcare Providers:  SeriousBroker.it  This test is no t yet approved or cleared by the [place] FDA and  has been authorized for detection and/or diagnosis of SARS-CoV-2 by FDA under an Emergency Use Authorization (EUA). This EUA will remain  in effect (meaning this test can be used) for the duration of the COVID-19 declaration under Section 564(b)(1) of the Act,  21 U.S.C.section 360bbb-3(b)(1), unless the authorization is terminated  or revoked sooner.       Influenza A by PCR NEGATIVE NEGATIVE Final   Influenza B by PCR NEGATIVE NEGATIVE Final    Comment: (NOTE) The Xpert Xpress SARS-CoV-2/FLU/RSV plus assay is intended as an aid in the diagnosis of influenza from Nasopharyngeal swab specimens and should not be used as a sole basis for treatment. Nasal washings and aspirates are unacceptable for Xpert Xpress SARS-CoV-2/FLU/RSV testing.  Fact Sheet for Patients: BloggerCourse.com  Fact Sheet for Healthcare Providers: SeriousBroker.it  This test is not yet approved or cleared by the [place] FDA and has been authorized for detection and/or diagnosis of SARS-CoV-2 by FDA under an Emergency Use Authorization (EUA). This EUA will remain in effect (meaning this test can be used) for the duration of the COVID-19 declaration under Section 564(b)(1) of the Act, 21 U.S.C. section 360bbb-3(b)(1), unless the authorization is terminated or revoked.  Performed at [facility], [address] [place], [place] [address]   Blood Culture (routine x 2)     Status: None   Collection Time: 08/27/21  8:58 AM   Specimen: BLOOD  Result Value Ref Range Status   Specimen Description   Final    BLOOD BLOOD LEFT FOREARM Performed at [facility], [address], [place], [place] [address]    Special Requests   Final    BOTTLES DRAWN AEROBIC AND ANAEROBIC Blood Culture results may not be optimal due to an inadequate volume of blood received in culture bottles Performed at [facility], [address] [place], [place] [address]    Culture   Final    NO GROWTH 5 DAYS Performed at [facility], [address] [place], [place] [address]    Report Status 09/01/2021 FINAL  Final  Blood Culture (routine x 2)     Status: Abnormal   Collection  Time: 08/27/21  9:19 AM   Specimen: BLOOD  Result Value Ref Range Status   Specimen Description   Final    BLOOD BLOOD RIGHT HAND Performed at [facility], [address], [place], [place] [address]    Special Requests   Final    BOTTLES DRAWN AEROBIC AND ANAEROBIC Blood Culture results may not be optimal due to an inadequate volume of blood received in culture bottles Performed at [facility], [address] [place], [place] [address]    Culture  Setup Time   Final    GRAM POSITIVE COCCI AEROBIC BOTTLE ONLY CRITICAL RESULT CALLED TO, READ BACK BY AND VERIFIED WITH: M LILLISTON,PHARMD@[id number]  08/28/21 MK    Culture (A)  Final    STAPHYLOCOCCUS AUREUS STAPHYLOCOCCUS EPIDERMIDIS THE SIGNIFICANCE OF ISOLATING THIS ORGANISM FROM A SINGLE SET OF BLOOD CULTURES WHEN MULTIPLE SETS ARE DRAWN IS UNCERTAIN. PLEASE NOTIFY THE MICROBIOLOGY DEPARTMENT WITHIN ONE WEEK IF SPECIATION AND SENSITIVITIES ARE  REQUIRED. Performed at [facility], [address] [place], [place] [address]    Report Status 08/31/2021 FINAL  Final   Organism ID, Bacteria STAPHYLOCOCCUS AUREUS  Final      Susceptibility   Staphylococcus aureus - MIC*    CIPROFLOXACIN <=0.5 SENSITIVE Sensitive     ERYTHROMYCIN <=0.25 SENSITIVE Sensitive     GENTAMICIN <=0.5 SENSITIVE Sensitive     OXACILLIN <=0.25 SENSITIVE Sensitive     TETRACYCLINE <=1 SENSITIVE Sensitive     VANCOMYCIN <=0.5 SENSITIVE Sensitive     TRIMETH/SULFA <=10 SENSITIVE Sensitive     CLINDAMYCIN <=0.25 SENSITIVE Sensitive     RIFAMPIN <=0.5 SENSITIVE Sensitive     Inducible Clindamycin NEGATIVE Sensitive     * STAPHYLOCOCCUS AUREUS  Blood Culture ID Panel (Reflexed)     Status: Abnormal   Collection Time: 08/27/21  9:19 AM  Result Value Ref Range Status   Enterococcus faecalis NOT DETECTED NOT DETECTED Final   Enterococcus Faecium NOT DETECTED NOT DETECTED Final   Listeria monocytogenes NOT DETECTED NOT DETECTED  Final   Staphylococcus species DETECTED (A) NOT DETECTED Final    Comment: CRITICAL RESULT CALLED TO, READ BACK BY AND VERIFIED WITH: M LILLISTON,PHARMD@[id number]  08/28/21 MK    Staphylococcus aureus (BCID) DETECTED (A) NOT DETECTED Final    Comment: Methicillin (oxacillin) susceptible Staphylococcus aureus (MSSA). Preferred therapy is anti staphylococcal beta lactam antibiotic (Cefazolin or Nafcillin), unless clinically contraindicated. CRITICAL RESULT CALLED TO, READ BACK BY AND VERIFIED WITH: M LILLISTON,PHARMD@[email or web address]  08/28/21 MK    Staphylococcus epidermidis DETECTED (A) NOT DETECTED Final    Comment: CRITICAL RESULT CALLED TO, READ BACK BY AND VERIFIED WITH: M LILLISTON,PHARMD@0649  08/28/21 MK    Staphylococcus lugdunensis NOT DETECTED NOT DETECTED Final   Streptococcus species NOT DETECTED NOT DETECTED Final   Streptococcus agalactiae NOT DETECTED NOT DETECTED Final   Streptococcus pneumoniae NOT DETECTED NOT DETECTED Final   Streptococcus pyogenes NOT DETECTED NOT DETECTED Final   A.calcoaceticus-baumannii NOT DETECTED NOT DETECTED Final   Bacteroides fragilis NOT DETECTED NOT DETECTED Final   Enterobacterales NOT DETECTED NOT DETECTED Final   Enterobacter cloacae complex NOT DETECTED NOT DETECTED Final   Escherichia coli NOT DETECTED NOT DETECTED Final   Klebsiella aerogenes NOT DETECTED NOT DETECTED Final   Klebsiella oxytoca NOT DETECTED NOT DETECTED Final   Klebsiella pneumoniae NOT DETECTED NOT DETECTED Final   Proteus species NOT DETECTED NOT DETECTED Final   Salmonella species NOT DETECTED NOT DETECTED Final   Serratia marcescens NOT DETECTED NOT DETECTED Final   Haemophilus influenzae NOT DETECTED NOT DETECTED Final   Neisseria meningitidis NOT DETECTED NOT DETECTED Final   Pseudomonas aeruginosa NOT DETECTED NOT DETECTED Final   Stenotrophomonas maltophilia NOT DETECTED NOT DETECTED Final   Candida albicans NOT DETECTED NOT DETECTED Final   Candida auris NOT DETECTED NOT  DETECTED Final   Candida glabrata NOT DETECTED NOT DETECTED Final   Candida krusei NOT DETECTED NOT DETECTED Final   Candida parapsilosis NOT DETECTED NOT DETECTED Final   Candida tropicalis NOT DETECTED NOT DETECTED Final   Cryptococcus neoformans/gattii NOT DETECTED NOT DETECTED Final   Methicillin resistance mecA/C NOT DETECTED NOT DETECTED Final   Meth resistant mecA/C and MREJ NOT DETECTED NOT DETECTED Final    Comment: Performed at [facility], [address] [place], [place] [address]  Culture, blood (routine x 2)     Status: None (Preliminary result)   Collection Time: 08/29/21  8:08 AM   Specimen: BLOOD  Result Value  Ref Range Status   Specimen Description   Final    BLOOD LEFT ANTECUBITAL Performed at [facility], [address], [place], [place] [address]    Special Requests   Final    BOTTLES DRAWN AEROBIC AND ANAEROBIC Blood Culture adequate volume Performed at [facility], [address], [place], [place] [address]    Culture   Final    NO GROWTH 4 DAYS Performed at [facility], [address], [place], [place] [address]    Report Status PENDING  Incomplete  Culture, blood (routine x 2)     Status: None (Preliminary result)   Collection Time: 08/29/21  8:18 AM   Specimen: BLOOD  Result Value Ref Range Status   Specimen Description   Final    BLOOD RIGHT ANTECUBITAL Performed at [facility], [address] [place], [place] [address]    Special Requests   Final    BOTTLES DRAWN AEROBIC AND ANAEROBIC Blood Culture adequate volume Performed at [facility], [address], [place], [place] [address]    Culture   Final    NO GROWTH 4 DAYS Performed at [facility], [address] [place], [place] [address]    Report Status PENDING  Incomplete    Labs: CBC: Recent Labs  Lab 08/27/21 0415 08/28/21 0333 08/29/21 0810 08/31/21 0604  WBC 13.9* 8.0 4.8 9.4   NEUTROABS 11.3*  --   --   --   HGB 15.9 13.3 14.2 13.5  HCT 47.8 39.2 42.8 39.8  MCV 91.6 89.7 90.9 92.1  PLT 65* 188 118* 139*   Basic Metabolic Panel: Recent Labs  Lab 08/27/21 0415 08/28/21 0333 08/29/21 0810 08/31/21 0604 09/01/21 0850  NA 130* 135 134* 138 135  K 4.2 3.7 4.1 5.1 4.1  CL 97* 100 100 108 103  CO2 15* 27 27 20* 24  GLUCOSE 97 103* 97 176* 103*  BUN 28* 14 9 36* 17  CREATININE 1.74* 0.81 0.64 1.58* 0.68  CALCIUM 8.6* 8.2* 8.6* 8.4* 8.7*  MG  --   --  1.9  --   --    Liver Function Tests: Recent Labs  Lab 08/27/21 0415 08/28/21 0333 08/29/21 0810 08/31/21 0604  AST 140* 173* 94* 18  ALT 139* 143* 134* 19  ALKPHOS 66 48 46 58  BILITOT 2.0* 1.0 1.1 0.9  PROT 8.3* 6.4* 7.1 6.8  ALBUMIN 4.5 3.5 3.6 3.9   CBG: No results for input(s): GLUCAP in the last 168 hours.  Discharge time spent: less than 30 minutes.  Signed: Hollice Espy, MD [facility] 09/02/2021

## 2021-09-02 NOTE — TOC Progression Note (Signed)
Transition of Care [facility]) - Progression Note    Patient Details  Name: Jordan Adams MRN: [id number] Date of Birth: 29-Aug-1990  Transition of Care [facility]) CM/SW Contact  Geni Bers, RN Phone Number: 09/02/2021, 9:53 AM  Clinical Narrative:    Spoke with pt concerning PCP. Explained to pt that a list of 3 [facility] PCP's will be  placed on AVS. Pt may call for an appointment. Pt agreed to call to make an appointment for hospital follow. Pt states there are no other needs.    Expected Discharge Plan: Home/Self Care Barriers to Discharge: Continued Medical Work up  Expected Discharge Plan and Services Expected Discharge Plan: Home/Self Care   Discharge Planning Services: CM Consult   Living arrangements for the past 2 months: Apartment                                       Social Determinants of Health (SDOH) Interventions    Readmission Risk Interventions No flowsheet data found.

## 2021-09-02 NOTE — Progress Notes (Signed)
AVS and discharge instructions reviewed w/ patient. Patient verbalized understanding and had no further questions. 

## 2021-09-03 LAB — CULTURE, BLOOD (ROUTINE X 2)
Culture: NO GROWTH
Culture: NO GROWTH
Special Requests: ADEQUATE
Special Requests: ADEQUATE

## 2021-09-04 LAB — HCV RNA QUANT RFLX ULTRA OR GENOTYP
HCV RNA Qnt(log copy/mL): 6.444 log10 IU/mL
HepC Qn: 2780000 IU/mL

## 2021-09-04 LAB — HEPATITIS C GENOTYPE: Hepatitis C Genotype: 3

## 2021-09-09 ENCOUNTER — Encounter (HOSPITAL_COMMUNITY): Payer: Self-pay

## 2021-09-09 ENCOUNTER — Emergency Department (HOSPITAL_COMMUNITY): Payer: Self-pay

## 2021-09-09 ENCOUNTER — Emergency Department (HOSPITAL_COMMUNITY)
Admission: EM | Admit: 2021-09-09 | Discharge: 2021-09-09 | Payer: Self-pay | Attending: Emergency Medicine | Admitting: Emergency Medicine

## 2021-09-09 DIAGNOSIS — R519 Headache, unspecified: Secondary | ICD-10-CM | POA: Insufficient documentation

## 2021-09-09 DIAGNOSIS — H5712 Ocular pain, left eye: Secondary | ICD-10-CM | POA: Insufficient documentation

## 2021-09-09 DIAGNOSIS — R52 Pain, unspecified: Secondary | ICD-10-CM

## 2021-09-09 DIAGNOSIS — S6992XA Unspecified injury of left wrist, hand and finger(s), initial encounter: Secondary | ICD-10-CM | POA: Insufficient documentation

## 2021-09-09 DIAGNOSIS — S20319A Abrasion of unspecified front wall of thorax, initial encounter: Secondary | ICD-10-CM | POA: Insufficient documentation

## 2021-09-09 MED ORDER — IBUPROFEN 800 MG PO TABS
800.0000 mg | ORAL_TABLET | Freq: Once | ORAL | Status: AC
  Administered 2021-09-09: 800 mg via ORAL
  Filled 2021-09-09: qty 1

## 2021-09-09 MED ORDER — ACETAMINOPHEN 500 MG PO TABS
1000.0000 mg | ORAL_TABLET | Freq: Once | ORAL | Status: AC
  Administered 2021-09-09: 1000 mg via ORAL
  Filled 2021-09-09: qty 2
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
## 2021-09-27 ENCOUNTER — Inpatient Hospital Stay: Payer: Self-pay | Admitting: Internal Medicine

## 2021-09-27 ENCOUNTER — Encounter (HOSPITAL_COMMUNITY): Payer: Self-pay | Admitting: Cardiology

## 2022-08-11 ENCOUNTER — Other Ambulatory Visit (HOSPITAL_COMMUNITY): Payer: Self-pay

## 2023-09-17 IMAGING — CR DG CHEST 2V
2 series · 2 of 2 positions shown · non-contrast
Comparison: CT chest 08/27/2021

CLINICAL DATA: Chest pain

EXAM:
CHEST - 2 VIEW

[w chest pa]
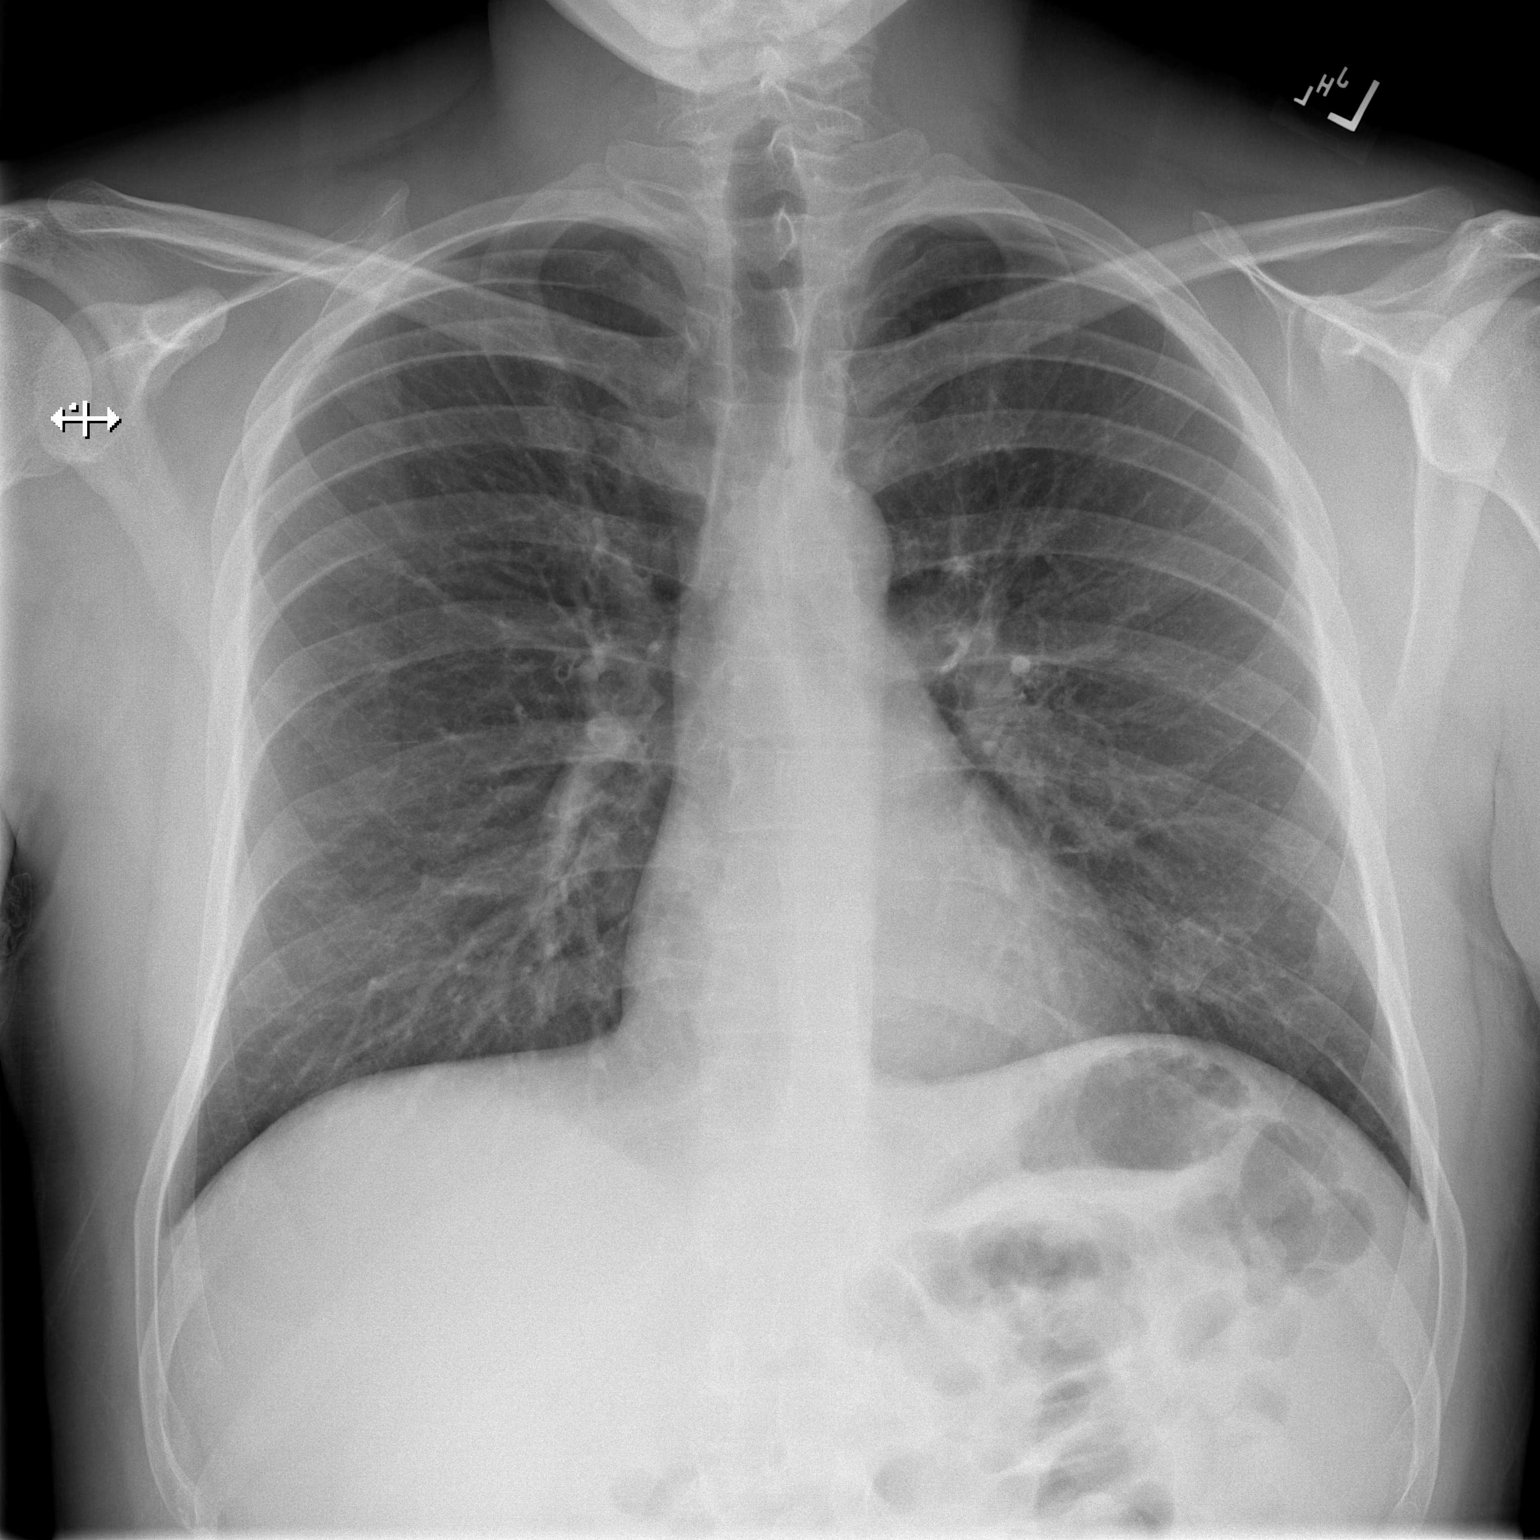

[w chest lat]
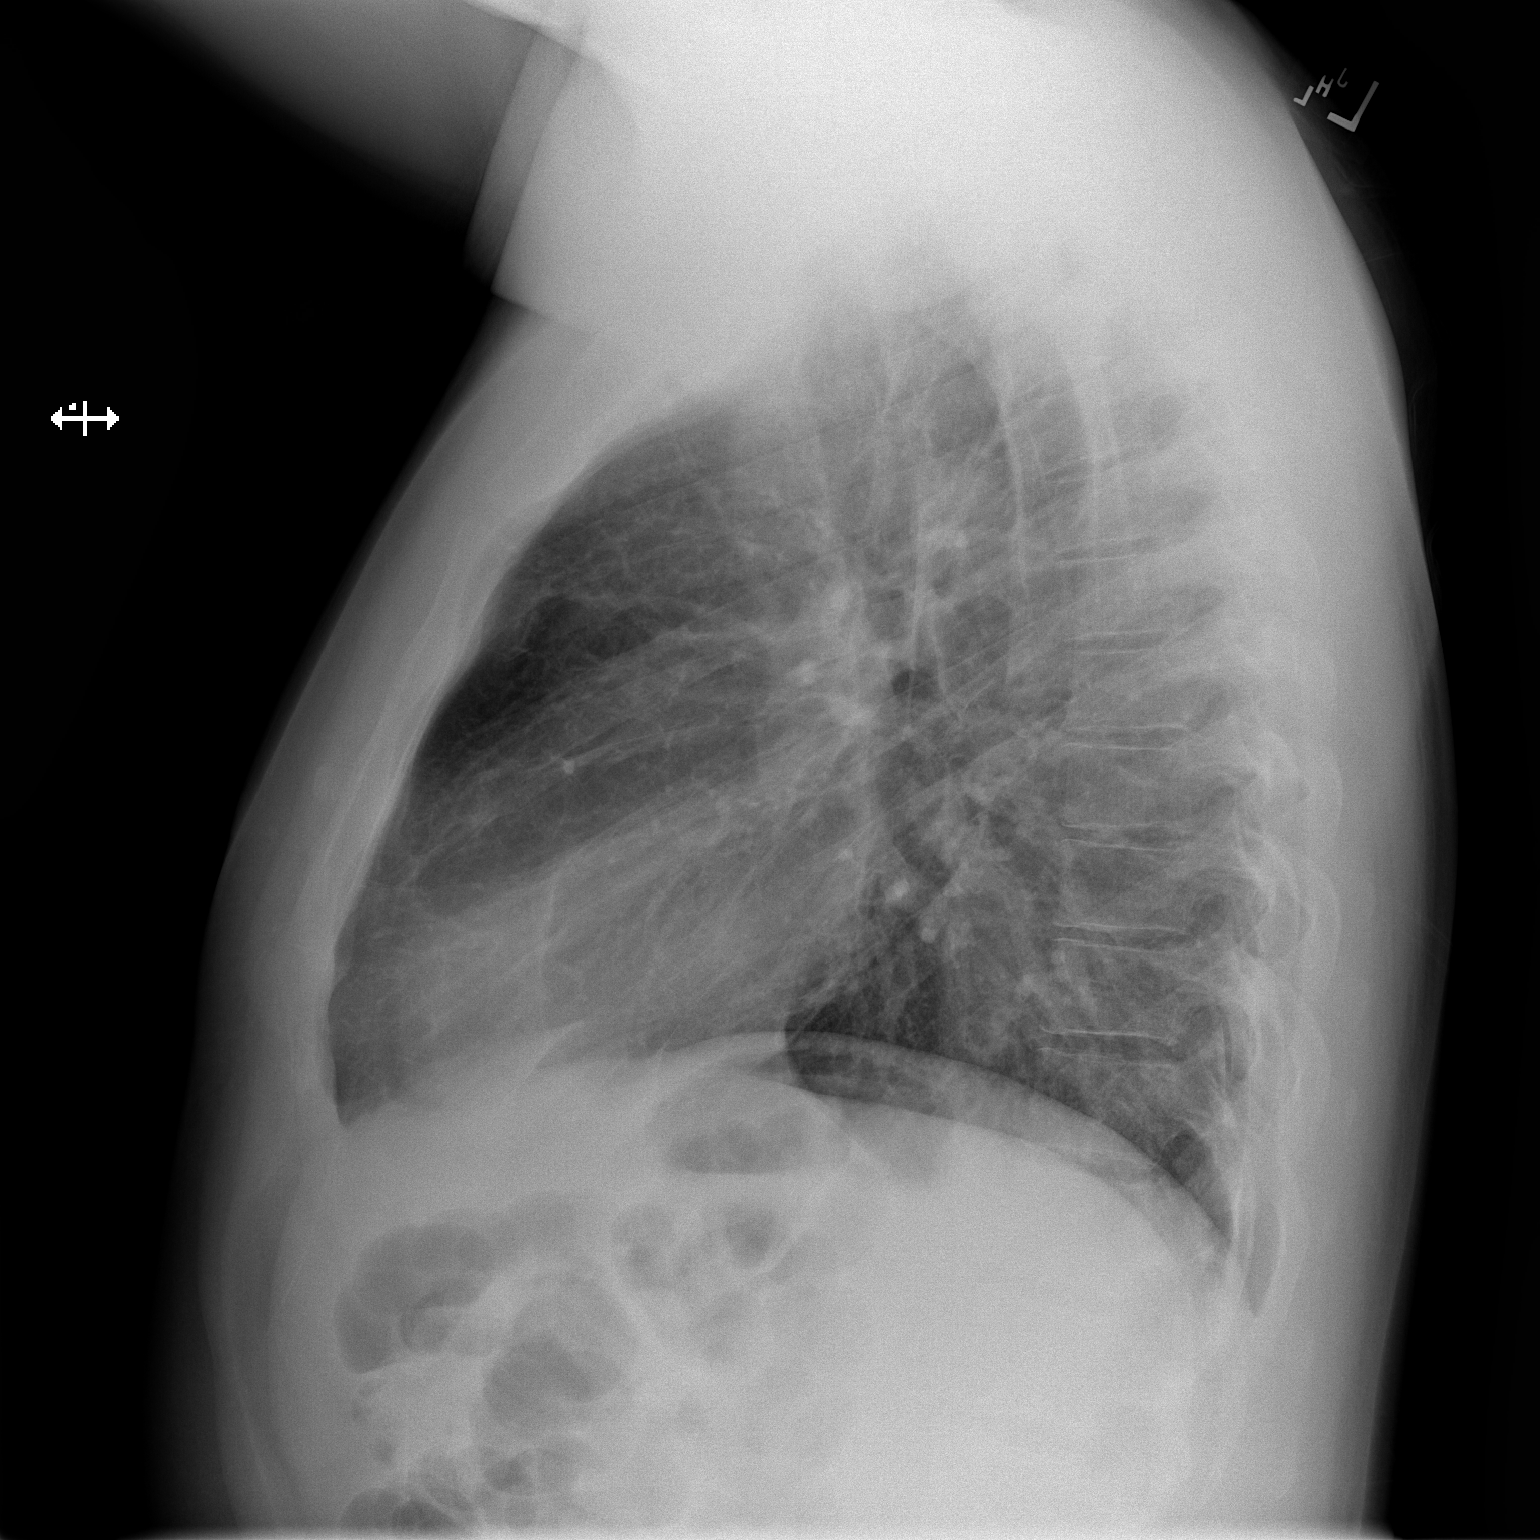

[2 of 2 positions shown; findings below may reference images not displayed]

FINDINGS: The heart size and mediastinal contours are within normal limits.
Both lungs are clear. The visualized skeletal structures are
unremarkable.
IMPRESSION: No active cardiopulmonary disease.
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
